# Patient Record
Sex: Female | Born: 1955 | Race: White | Hispanic: No | Marital: Married | Smoking: Current every day smoker
Health system: Southern US, Community
[De-identification: ages and names within clinical notes are randomized; demographics above are authoritative.]

## PROBLEM LIST (undated history)

## (undated) DIAGNOSIS — R011 Cardiac murmur, unspecified: Secondary | ICD-10-CM

## (undated) DIAGNOSIS — Z87891 Personal history of nicotine dependence: Secondary | ICD-10-CM

## (undated) DIAGNOSIS — Z1211 Encounter for screening for malignant neoplasm of colon: Secondary | ICD-10-CM

## (undated) DIAGNOSIS — Z1239 Encounter for other screening for malignant neoplasm of breast: Secondary | ICD-10-CM

## (undated) HISTORY — DX: Encounter for other screening for malignant neoplasm of breast: Z12.39

## (undated) HISTORY — DX: Encounter for screening for malignant neoplasm of colon: Z12.11

## (undated) HISTORY — PX: TUBAL LIGATION: SHX77

## (undated) HISTORY — DX: Personal history of nicotine dependence: Z87.891

## (undated) HISTORY — DX: Cardiac murmur, unspecified: R01.1

## (undated) HISTORY — PX: TONSILLECTOMY: SUR1361

---

## 2001-10-28 HISTORY — PX: BREAST BIOPSY: SHX20

## 2005-02-13 ENCOUNTER — Ambulatory Visit: Payer: Self-pay | Admitting: General Surgery

## 2006-03-14 ENCOUNTER — Ambulatory Visit: Payer: Self-pay | Admitting: General Surgery

## 2007-06-26 ENCOUNTER — Ambulatory Visit: Payer: Self-pay | Admitting: General Surgery

## 2008-06-30 ENCOUNTER — Ambulatory Visit: Payer: Self-pay | Admitting: General Surgery

## 2009-08-28 HISTORY — PX: COLONOSCOPY: SHX174

## 2009-09-02 ENCOUNTER — Ambulatory Visit: Payer: Self-pay | Admitting: General Surgery

## 2009-10-22 ENCOUNTER — Ambulatory Visit: Payer: Self-pay | Admitting: General Surgery

## 2010-11-14 ENCOUNTER — Ambulatory Visit: Payer: Self-pay | Admitting: General Surgery

## 2011-11-15 ENCOUNTER — Ambulatory Visit: Payer: Self-pay | Admitting: General Surgery

## 2012-10-12 ENCOUNTER — Encounter: Payer: Self-pay | Admitting: General Surgery

## 2012-11-26 ENCOUNTER — Ambulatory Visit: Payer: Self-pay | Admitting: General Surgery

## 2012-11-27 ENCOUNTER — Encounter: Payer: Self-pay | Admitting: General Surgery

## 2012-12-17 ENCOUNTER — Ambulatory Visit: Payer: Self-pay | Admitting: General Surgery

## 2012-12-26 ENCOUNTER — Encounter: Payer: Self-pay | Admitting: *Deleted

## 2013-01-28 ENCOUNTER — Other Ambulatory Visit: Payer: Self-pay

## 2013-01-28 ENCOUNTER — Ambulatory Visit (INDEPENDENT_AMBULATORY_CARE_PROVIDER_SITE_OTHER): Payer: Managed Care, Other (non HMO) | Admitting: General Surgery

## 2013-01-28 ENCOUNTER — Encounter: Payer: Self-pay | Admitting: General Surgery

## 2013-01-28 VITALS — BP 148/82 | HR 76 | Resp 14 | Ht 66.0 in | Wt 130.0 lb

## 2013-01-28 DIAGNOSIS — Z1239 Encounter for other screening for malignant neoplasm of breast: Secondary | ICD-10-CM

## 2013-01-28 DIAGNOSIS — N63 Unspecified lump in unspecified breast: Secondary | ICD-10-CM

## 2013-01-28 NOTE — Patient Instructions (Addendum)
Continue self breast exams. Call office for any new breast issues or concerns. 

## 2013-01-28 NOTE — Progress Notes (Signed)
Patient ID: Peggy Nielsen, female   DOB: 1956-03-16, 57 y.o.   MRN: [id number]  Chief Complaint  Patient presents with  . Follow-up    mammogram    HPI Peggy Nielsen is a 57 y.o. female.  Patient here today for follow up mammogram.  Denies breast issues. Patient with known history of left breast biopsy done 10-28-01. Patient was adopted, family history unknown.   HPI  Past Medical History  Diagnosis Date  . Personal history of tobacco use, presenting hazards to health   . Murmur   . Breast screening, unspecified   . Special screening for malignant neoplasms, colon     Past Surgical History  Procedure Laterality Date  . Tonsillectomy    . Tubal ligation    . Colonoscopy  2011    Dr. Evette Cristal  . Breast biopsy Left 10-28-01    Family History  Problem Relation Age of Onset  . Adopted: Yes    Social History History  Substance Use Topics  . Smoking status: Current Every Day Smoker -- 1.00 packs/day for 20 years  . Smokeless tobacco: Not on file  . Alcohol Use: Yes     Comment: occasionally    No Known Allergies  Current Outpatient Prescriptions  Medication Sig Dispense Refill  . CRESTOR 20 MG tablet Take 1 tablet by mouth daily.       No current facility-administered medications for this visit.    Review of Systems Review of Systems  Constitutional: Negative.   Respiratory: Negative.   Cardiovascular: Negative.     Blood pressure 148/82, pulse 76, resp. rate 14, height 5\' 6"  (1.676 m), weight 130 lb (58.968 kg).  Physical Exam Physical Exam  Constitutional: She is oriented to person, place, and time. She appears well-developed and well-nourished.  Eyes: Conjunctivae are normal.  Cardiovascular: Normal rate and regular rhythm.   Pulmonary/Chest: Effort normal and breath sounds normal. Right breast exhibits mass. Right breast exhibits no inverted nipple, no nipple discharge, no skin change and no tenderness. Left breast exhibits no inverted nipple, no mass, no nipple  discharge, no skin change and no tenderness.  Lymphadenopathy:    She has no cervical adenopathy.    She has no axillary adenopathy.  Neurological: She is alert and oriented to person, place, and time.  Skin: Skin is warm and dry.  5 mm nodule right breast areolar margin at 10-11 o'clock position   Data Reviewed Mammogram reviewed  Assessment    5 mm nodule right breast areolar margin at 10-11 o'clock position  Ultrasound shows 6.54mm size hypoechoic mass over palpable area      Plan    Close follow up Recheck in 3 months       SANKAR,SEEPLAPUTHUR G 01/30/2013, 6:12 AM

## 2013-01-30 ENCOUNTER — Encounter: Payer: Self-pay | Admitting: General Surgery

## 2013-01-30 DIAGNOSIS — N63 Unspecified lump in unspecified breast: Secondary | ICD-10-CM | POA: Insufficient documentation

## 2013-05-20 ENCOUNTER — Ambulatory Visit (INDEPENDENT_AMBULATORY_CARE_PROVIDER_SITE_OTHER): Payer: Managed Care, Other (non HMO) | Admitting: General Surgery

## 2013-05-20 ENCOUNTER — Encounter: Payer: Self-pay | Admitting: General Surgery

## 2013-05-20 VITALS — BP 144/80 | HR 70 | Resp 12 | Ht 66.0 in | Wt 128.0 lb

## 2013-05-20 DIAGNOSIS — N63 Unspecified lump in unspecified breast: Secondary | ICD-10-CM

## 2013-05-20 NOTE — Progress Notes (Signed)
Patient ID: Peggy Nielsen, female   DOB: 24-Oct-1955, 57 y.o.   MRN: [id number]  Chief Complaint  Patient presents with  . Follow-up    HPI Peggy Nielsen is a 57 y.o. female.  Here today for 3 months follow up for a 5 mm nodule right breast areolar margin at 10-11 o'clock position.  Denies any new breast issues.  HPI  Past Medical History  Diagnosis Date  . Personal history of tobacco use, presenting hazards to health   . Murmur   . Breast screening, unspecified   . Special screening for malignant neoplasms, colon     Past Surgical History  Procedure Laterality Date  . Tonsillectomy    . Tubal ligation    . Colonoscopy  2011    Dr. Evette Cristal  . Breast biopsy Left 10-28-01    Family History  Problem Relation Age of Onset  . Adopted: Yes    Social History History  Substance Use Topics  . Smoking status: Current Every Day Smoker -- 1.00 packs/day for 20 years  . Smokeless tobacco: Not on file  . Alcohol Use: Yes     Comment: occasionally    Allergies  Allergen Reactions  . Ceclor [Cefaclor] Swelling    Current Outpatient Prescriptions  Medication Sig Dispense Refill  . CRESTOR 20 MG tablet Take 1 tablet by mouth daily.       No current facility-administered medications for this visit.    Review of Systems Review of Systems  Constitutional: Negative.   Respiratory: Negative.   Cardiovascular: Negative.     Blood pressure 144/80, pulse 70, resp. rate 12, height 5\' 6"  (1.676 m), weight 128 lb (58.06 kg).  Physical Exam Physical Exam  Constitutional: She is oriented to person, place, and time. She appears well-developed and well-nourished.  Pulmonary/Chest: Right breast exhibits mass (5-6 mm nodule at 10 o'clock areolar margin). Right breast exhibits no inverted nipple, no nipple discharge, no skin change and no tenderness.  Lymphadenopathy:    She has no axillary adenopathy.  Neurological: She is alert and oriented to person, place, and time.  Skin: Skin is warm  and dry.    Data Reviewed  Prior ultrasound Assessment    Presistant right breast mass.    Plan    discussed observation or excision in the office and patient prefers to have an excision.       Anvi Mangal G 05/20/2013, 11:20 AM

## 2013-05-20 NOTE — Patient Instructions (Addendum)
Continue self breast exams. Call office for any new breast issues or concerns. 

## 2013-06-11 ENCOUNTER — Encounter: Payer: Self-pay | Admitting: General Surgery

## 2013-06-11 ENCOUNTER — Ambulatory Visit (INDEPENDENT_AMBULATORY_CARE_PROVIDER_SITE_OTHER): Payer: Managed Care, Other (non HMO) | Admitting: General Surgery

## 2013-06-11 VITALS — BP 150/80 | HR 76 | Resp 14 | Ht 66.0 in | Wt 128.0 lb

## 2013-06-11 DIAGNOSIS — N63 Unspecified lump in unspecified breast: Secondary | ICD-10-CM

## 2013-06-11 NOTE — Progress Notes (Signed)
Patient ID: Peggy Nielsen, female   DOB: 1956-04-30, 57 y.o.   MRN: [id number] Here today for excision of right breast mass.  Procedure:   Mass in right breast at 10 ocl atreolar margin was again identified. 7ml of 1% xylocaine mixed with 0.5%marcaine was instilled.  Area prepped with chlora prep. 1.5cm circumareolar incision made at 10 o'cl. Mass identified -6-75mm size and excised out.  Bleeding controlled with disposable cautery. Wound closed with 3-0 vicryl in deeper plane and skin closed with 4-0 vicryl subcuticular.  dermabond applied. Prcedure well tolerated.

## 2013-06-11 NOTE — Patient Instructions (Addendum)
Keep area clean Follow up as scheduled Use ice pack for comfort today

## 2013-06-17 ENCOUNTER — Telehealth: Payer: Self-pay | Admitting: General Surgery

## 2013-06-17 ENCOUNTER — Telehealth: Payer: Self-pay | Admitting: *Deleted

## 2013-06-17 NOTE — Telephone Encounter (Signed)
PATIENT CALLED TODAY TO OBTAIN HER RESULTS FROM A RT BR BX DONE 06-11-13 IN THE OFFICE.PLEASE CONTACT WITH RESULTS.

## 2013-06-17 NOTE — Telephone Encounter (Signed)
Notified patient as instructed, patient pleased. Discussed follow-up appointments, patient agrees  

## 2013-06-18 LAB — PATHOLOGY

## 2013-07-03 ENCOUNTER — Other Ambulatory Visit: Payer: Self-pay

## 2013-12-11 ENCOUNTER — Ambulatory Visit: Payer: Self-pay | Admitting: General Surgery

## 2013-12-15 ENCOUNTER — Encounter: Payer: Self-pay | Admitting: General Surgery

## 2013-12-18 ENCOUNTER — Ambulatory Visit: Payer: Managed Care, Other (non HMO) | Admitting: General Surgery

## 2013-12-29 ENCOUNTER — Ambulatory Visit (INDEPENDENT_AMBULATORY_CARE_PROVIDER_SITE_OTHER): Payer: Managed Care, Other (non HMO) | Admitting: General Surgery

## 2013-12-29 ENCOUNTER — Encounter: Payer: Self-pay | Admitting: General Surgery

## 2013-12-29 VITALS — BP 124/74 | HR 72 | Resp 12 | Ht 66.0 in | Wt 125.0 lb

## 2013-12-29 DIAGNOSIS — L989 Disorder of the skin and subcutaneous tissue, unspecified: Secondary | ICD-10-CM

## 2013-12-29 DIAGNOSIS — N6019 Diffuse cystic mastopathy of unspecified breast: Secondary | ICD-10-CM

## 2013-12-29 DIAGNOSIS — N63 Unspecified lump in unspecified breast: Secondary | ICD-10-CM

## 2013-12-29 NOTE — Progress Notes (Signed)
Patient ID: Peggy Nielsen, female   DOB: [birth year], 58 y.o.   MRN: [id number]  Chief Complaint  Patient presents with  . Follow-up    mammogram    HPI [name] is a 58 y.o. female who presents for a breast evaluation. The most recent mammogram was done on 12/02/13 Patient does perform regular self breast checks and gets regular mammograms done.  Last year she had a small right breast mass subareolar excised. Pathology showed benign process with apocrine cysts.   HPI  Past Medical History  Diagnosis Date  . Personal history of tobacco use, presenting hazards to health   . Murmur   . Breast screening, unspecified   . Special screening for malignant neoplasms, colon     Past Surgical History  Procedure Laterality Date  . Tonsillectomy    . Tubal ligation    . Colonoscopy  2011    Dr. Jamal Collin  . Breast biopsy Left 10-28-01    Family History  Problem Relation Age of Onset  . Adopted: Yes    Social History History  Substance Use Topics  . Smoking status: Current Every Day Smoker -- 1.00 packs/day for 20 years  . Smokeless tobacco: Never Used  . Alcohol Use: Yes     Comment: occasionally    Allergies  Allergen Reactions  . Ceclor [Cefaclor] Swelling    Current Outpatient Prescriptions  Medication Sig Dispense Refill  . CRESTOR 20 MG tablet Take 1 tablet by mouth daily.       No current facility-administered medications for this visit.    Review of Systems Review of Systems  Constitutional: Negative.   Respiratory: Negative.   Cardiovascular: Negative.     Blood pressure 124/74, pulse 72, resp. rate 12, height 5\' 6"  (1.676 m), weight 125 lb (56.7 kg).  Physical Exam Physical Exam  Constitutional: She is oriented to person, place, and time. She appears well-developed and well-nourished. No distress.  HENT:  Head: Normocephalic and atraumatic.  Eyes: Conjunctivae are normal.  Neck: Neck supple.  Cardiovascular: Normal rate, regular rhythm and normal heart  sounds.  Exam reveals no gallop and no friction rub.   No murmur heard. Pulmonary/Chest: Effort normal and breath sounds normal. Right breast exhibits no inverted nipple, no mass, no nipple discharge, no skin change and no tenderness. Left breast exhibits no inverted nipple, no mass, no nipple discharge, no skin change and no tenderness. Breasts are symmetrical.  Lymphadenopathy:    She has no cervical adenopathy.    She has no axillary adenopathy.  Neurological: She is alert and oriented to person, place, and time.  Skin: Skin is dry.  6 mm fibrotic dark skin tag left upper abdominal wall appears larger than previous visit.   Data Reviewed Mammogram reviewed and stable.    Assessment    Stable exam. History of fibrocystic disease. Family history unknown. Enlarging skin tag left abdominal wall.     Plan       Skin tag removed today in office.  Area prepped with chloroprep. 0.5 ml 1%xylocaine instilled at base of ski tag.  Skin tag excised at base with scalpel, cauterized base with thermal cautery. Dressed with neosporin and band aid.    Seeplaputhur G Sankar 12/30/2013, 3:03 PM

## 2013-12-29 NOTE — Patient Instructions (Signed)
Patient to return in one year bilateral screening mammogram.

## 2013-12-30 ENCOUNTER — Encounter: Payer: Self-pay | Admitting: General Surgery

## 2013-12-30 DIAGNOSIS — N6019 Diffuse cystic mastopathy of unspecified breast: Secondary | ICD-10-CM | POA: Insufficient documentation

## 2013-12-31 LAB — PATHOLOGY

## 2014-01-06 ENCOUNTER — Telehealth: Payer: Self-pay | Admitting: *Deleted

## 2014-01-06 NOTE — Telephone Encounter (Signed)
Message copied by Carson Myrtle on Tue Jan 06, 2014  8:45 AM ------      Message from: Christene Lye      Created: Mon Jan 05, 2014  7:05 PM       Please let pt pt know the pathology was normal. ------

## 2014-01-06 NOTE — Telephone Encounter (Signed)
Notified patient as instructed, patient pleased. Discussed follow-up appointments, patient agrees  

## 2014-06-29 ENCOUNTER — Encounter: Payer: Self-pay | Admitting: General Surgery

## 2014-12-16 ENCOUNTER — Ambulatory Visit: Payer: Managed Care, Other (non HMO) | Admitting: General Surgery

## 2014-12-23 ENCOUNTER — Encounter: Payer: Self-pay | Admitting: General Surgery

## 2014-12-28 ENCOUNTER — Ambulatory Visit (INDEPENDENT_AMBULATORY_CARE_PROVIDER_SITE_OTHER): Payer: BLUE CROSS/BLUE SHIELD | Admitting: General Surgery

## 2014-12-28 VITALS — BP 158/76 | HR 70 | Resp 14 | Ht 66.0 in | Wt 124.0 lb

## 2014-12-28 DIAGNOSIS — N6019 Diffuse cystic mastopathy of unspecified breast: Secondary | ICD-10-CM | POA: Diagnosis not present

## 2014-12-28 NOTE — Progress Notes (Signed)
Patient ID: Peggy Nielsen, female   DOB: [birth year], 59 y.o.   MRN: [id number]  Chief Complaint  Patient presents with  . Follow-up    mammogram    HPI Peggy Nielsen is a 59 y.o. female who presents for a breast evaluation. The most recent mammogram was done on 12/21/14. Patient does perform regular self breast checks and gets regular mammograms done.     HPI  Past Medical History  Diagnosis Date  . Personal history of tobacco use, presenting hazards to health   . Murmur   . Breast screening, unspecified   . Special screening for malignant neoplasms, colon     Past Surgical History  Procedure Laterality Date  . Tonsillectomy    . Tubal ligation    . Colonoscopy  2011    Dr. Jamal Collin  . Breast biopsy Left 10-28-01    Family History  Problem Relation Age of Onset  . Adopted: Yes    Social History History  Substance Use Topics  . Smoking status: Current Every Day Smoker -- 1.00 packs/day for 20 years  . Smokeless tobacco: Never Used  . Alcohol Use: Yes     Comment: occasionally    Allergies  Allergen Reactions  . Ceclor [Cefaclor] Swelling    Current Outpatient Prescriptions  Medication Sig Dispense Refill  . CRESTOR 20 MG tablet Take 1 tablet by mouth daily.    Marland Kitchen lisinopril (PRINIVIL,ZESTRIL) 10 MG tablet      No current facility-administered medications for this visit.    Review of Systems Review of Systems  Constitutional: Negative.   Respiratory: Negative.   Cardiovascular: Negative.     Blood pressure 158/76, pulse 70, resp. rate 14, height 5\' 6"  (1.676 m), weight 124 lb (56.246 kg).  Physical Exam Physical Exam  Constitutional: She is oriented to person, place, and time. She appears well-developed and well-nourished.  Eyes: Conjunctivae are normal. No scleral icterus.  Neck: Neck supple.  Cardiovascular: Normal rate, regular rhythm and normal heart sounds.   Pulmonary/Chest: Effort normal and breath sounds normal. Right breast exhibits no inverted  nipple, no mass, no nipple discharge, no skin change and no tenderness. Left breast exhibits no inverted nipple, no mass, no nipple discharge, no skin change and no tenderness.  Abdominal: Soft. There is no hepatomegaly. There is no tenderness.  Lymphadenopathy:    She has no cervical adenopathy.    She has no axillary adenopathy.  Neurological: She is alert and oriented to person, place, and time.  Skin: Skin is warm and dry.    Data Reviewed Mammogram reviewed   Assessment    Stable exam      Plan    Patient will be asked to return to the office in one year with a bilateral screening mammogram.       Sashay Felling G 12/29/2014, 5:30 AM

## 2014-12-29 ENCOUNTER — Encounter: Payer: Self-pay | Admitting: General Surgery

## 2015-04-22 ENCOUNTER — Other Ambulatory Visit: Payer: Self-pay | Admitting: Orthopedic Surgery

## 2015-04-22 DIAGNOSIS — M5416 Radiculopathy, lumbar region: Secondary | ICD-10-CM

## 2015-04-24 ENCOUNTER — Ambulatory Visit: Payer: BLUE CROSS/BLUE SHIELD

## 2015-05-05 ENCOUNTER — Ambulatory Visit
Admission: RE | Admit: 2015-05-05 | Discharge: 2015-05-05 | Disposition: A | Discharge status: Home / Self Care | Payer: BLUE CROSS/BLUE SHIELD | Source: Ambulatory Visit | Attending: Orthopedic Surgery | Admitting: Orthopedic Surgery

## 2015-05-05 DIAGNOSIS — M5416 Radiculopathy, lumbar region: Secondary | ICD-10-CM | POA: Diagnosis present

## 2015-05-05 DIAGNOSIS — G9589 Other specified diseases of spinal cord: Secondary | ICD-10-CM | POA: Diagnosis not present

## 2015-05-05 DIAGNOSIS — M5127 Other intervertebral disc displacement, lumbosacral region: Secondary | ICD-10-CM | POA: Insufficient documentation

## 2015-10-18 ENCOUNTER — Encounter: Payer: Self-pay | Admitting: *Deleted

## 2016-01-04 ENCOUNTER — Ambulatory Visit: Payer: BLUE CROSS/BLUE SHIELD | Admitting: General Surgery

## 2016-01-17 ENCOUNTER — Encounter: Payer: Self-pay | Admitting: General Surgery

## 2016-02-03 ENCOUNTER — Ambulatory Visit: Payer: BLUE CROSS/BLUE SHIELD | Admitting: General Surgery

## 2016-02-10 ENCOUNTER — Ambulatory Visit: Payer: BLUE CROSS/BLUE SHIELD | Admitting: General Surgery

## 2016-02-14 ENCOUNTER — Ambulatory Visit (INDEPENDENT_AMBULATORY_CARE_PROVIDER_SITE_OTHER): Payer: BLUE CROSS/BLUE SHIELD | Admitting: General Surgery

## 2016-02-14 ENCOUNTER — Encounter: Payer: Self-pay | Admitting: General Surgery

## 2016-02-14 VITALS — BP 140/70 | HR 80 | Resp 14 | Ht 64.0 in | Wt 127.0 lb

## 2016-02-14 DIAGNOSIS — N6019 Diffuse cystic mastopathy of unspecified breast: Secondary | ICD-10-CM | POA: Diagnosis not present

## 2016-02-14 NOTE — Patient Instructions (Signed)
The patient has been asked to return to the office in one year with a bilateral screening mammogram. 

## 2016-02-14 NOTE — Progress Notes (Signed)
Patient ID: Peggy Nielsen, female   DOB: 03/24/1956, 60 y.o.   MRN: [id number]  Chief Complaint  Patient presents with  . Follow-up    mammogram     HPI Peggy Nielsen is a 60 y.o. female who presents for a breast evaluation. The most recent mammogram was done on 01/12/2016.  Patient does perform regular self breast checks and gets regular mammograms done.   I have reviewed the history of present illness with the patient.   HPI  Past Medical History  Diagnosis Date  . Personal history of tobacco use, presenting hazards to health   . Murmur   . Breast screening, unspecified   . Special screening for malignant neoplasms, colon     Past Surgical History  Procedure Laterality Date  . Tonsillectomy    . Tubal ligation    . Colonoscopy  2011    Dr. Jamal Collin  . Breast biopsy Left 10-28-01    Family History  Problem Relation Age of Onset  . Adopted: Yes    Social History Social History  Substance Use Topics  . Smoking status: Current Every Day Smoker -- 1.00 packs/day for 20 years  . Smokeless tobacco: Never Used  . Alcohol Use: Yes     Comment: occasionally    Allergies  Allergen Reactions  . Ceclor [Cefaclor] Swelling    Current Outpatient Prescriptions  Medication Sig Dispense Refill  . CRESTOR 20 MG tablet Take 1 tablet by mouth daily.    Marland Kitchen lisinopril (PRINIVIL,ZESTRIL) 10 MG tablet      No current facility-administered medications for this visit.    Review of Systems Review of Systems  Constitutional: Negative.   Respiratory: Negative.   Cardiovascular: Negative.     Blood pressure 140/70, pulse 80, resp. rate 14, height 5\' 4"  (1.626 m), weight 127 lb (57.607 kg).  Physical Exam Physical Exam  Constitutional: She is oriented to person, place, and time. She appears well-developed.  Eyes: Conjunctivae are normal. No scleral icterus.  Neck: Neck supple.  Cardiovascular: Normal rate, regular rhythm and intact distal pulses.   Pulmonary/Chest: Effort normal and  breath sounds normal. Right breast exhibits no inverted nipple, no mass, no nipple discharge, no skin change and no tenderness. Left breast exhibits no inverted nipple, no mass, no nipple discharge, no skin change and no tenderness.  Abdominal: Soft. Bowel sounds are normal. There is no tenderness.  Lymphadenopathy:    She has no cervical adenopathy.    She has no axillary adenopathy.  Neurological: She is alert and oriented to person, place, and time.  Skin: Skin is warm and dry.    Data Reviewed Mammogram reviewed and stable.  Assessment      Stable exam, Fibrocystic breast disease  Plan   The patient has been asked to return to the office in one year with a bilateral screening mammogram. Patient to continue with self-breast exams.     PCP:  Kathryne Eriksson 02/16/2016, 9:46 AM

## 2016-02-16 ENCOUNTER — Encounter: Payer: Self-pay | Admitting: General Surgery

## 2016-06-07 ENCOUNTER — Other Ambulatory Visit: Payer: Self-pay | Admitting: Neurology

## 2016-06-07 DIAGNOSIS — G250 Essential tremor: Secondary | ICD-10-CM

## 2016-06-20 ENCOUNTER — Ambulatory Visit: Payer: BLUE CROSS/BLUE SHIELD

## 2016-07-05 ENCOUNTER — Ambulatory Visit
Admission: RE | Admit: 2016-07-05 | Discharge: 2016-07-05 | Disposition: A | Discharge status: Home / Self Care | Payer: BLUE CROSS/BLUE SHIELD | Source: Ambulatory Visit | Attending: Neurology | Admitting: Neurology

## 2016-07-05 DIAGNOSIS — R9089 Other abnormal findings on diagnostic imaging of central nervous system: Secondary | ICD-10-CM | POA: Diagnosis not present

## 2016-07-05 DIAGNOSIS — G250 Essential tremor: Secondary | ICD-10-CM | POA: Diagnosis present

## 2016-07-05 DIAGNOSIS — R251 Tremor, unspecified: Secondary | ICD-10-CM | POA: Diagnosis present

## 2016-07-29 IMAGING — MR MR LUMBAR SPINE W/O CM
5 series · 35 of 48 positions shown · non-contrast
Comparison: None.

CLINICAL DATA: Low back pain and left leg pain since February 2015.

EXAM:
MRI LUMBAR SPINE WITHOUT CONTRAST
TECHNIQUE: Multiplanar, multisequence MR imaging of the lumbar spine was
performed. No intravenous contrast was administered.

[Series 2: T2 · sagittal · 4.0mm · 0.81mm/px · 6 of 17 slices shown (1 of 2)]
[im 1/17]
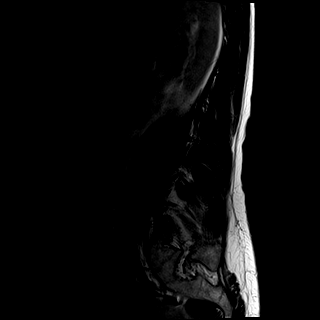
[im 4/17]
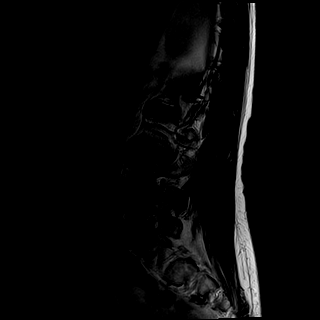
[im 7/17]
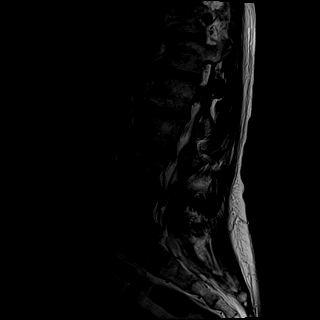
[im 10/17]
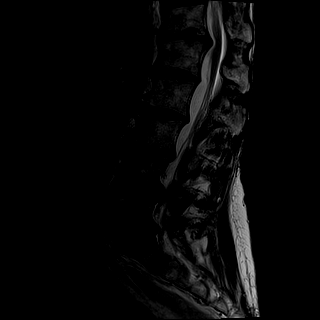
[im 13/17]
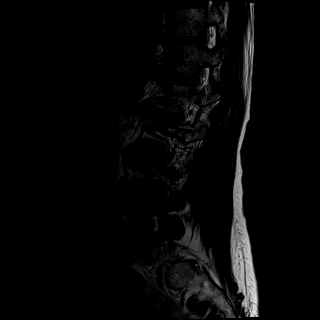
[im 17/17]
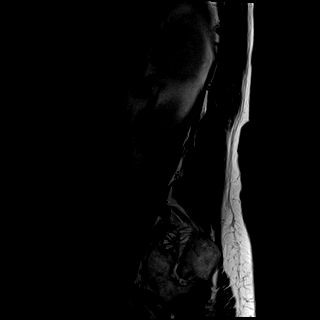

[Series 3: T1 · sagittal · 4.0mm · 0.81mm/px · 6 of 17 slices shown (1 of 2)]
[im 1/17]
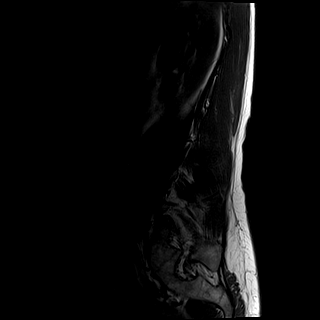
[im 4/17]
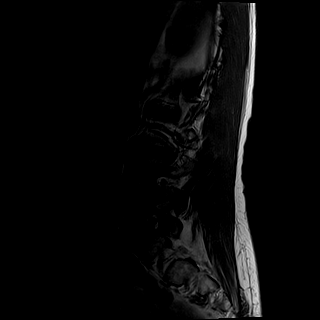
[im 7/17]
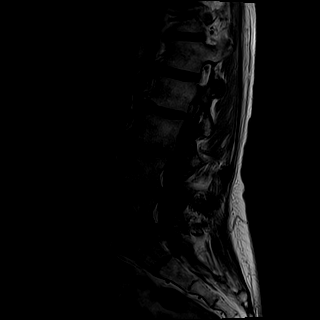
[im 10/17]
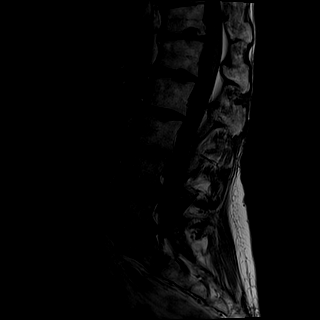
[im 13/17]
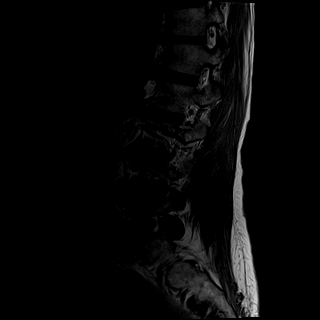
[im 17/17]
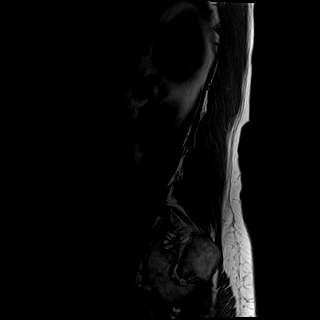

[Series 4: STIR · sagittal · 4.0mm · 1.02mm/px · 5 of 17 slices shown]
[im 1/17]
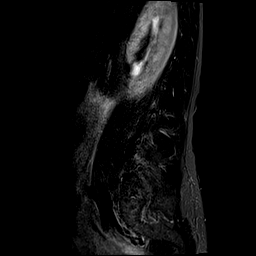
[im 4/17]
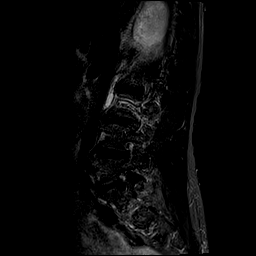
[im 7/17]
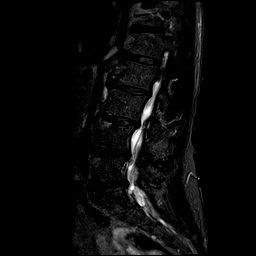
[im 10/17]
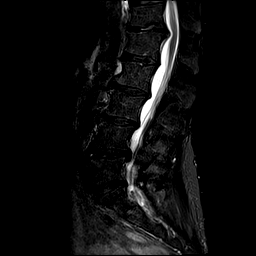
[im 13/17]
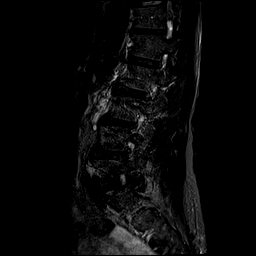

[Series 5: T2 · axial · 4.0mm · 0.78mm/px · z∈[-130,+80]mm · 9 of 39 slices shown (2 of 2)]
[im 1/39]
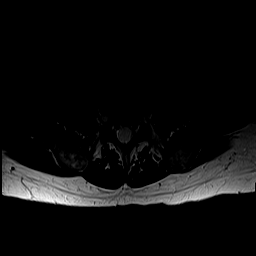
[im 6/39]
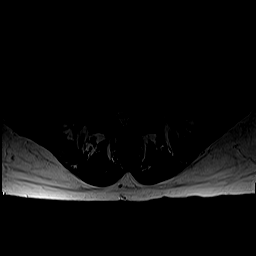
[im 11/39]
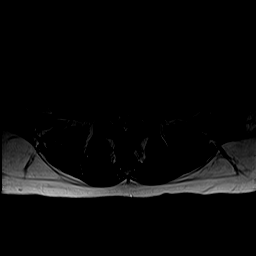
[im 17/39]
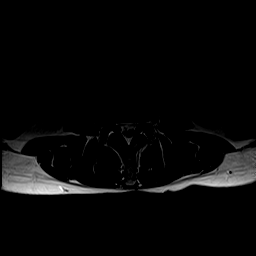
[im 20/39]
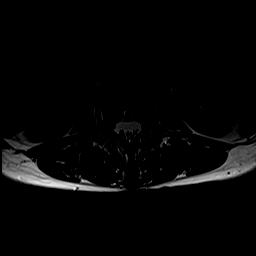
[im 22/39]
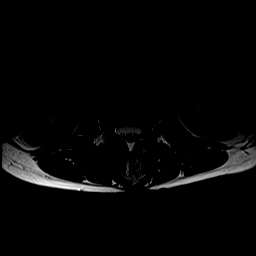
[im 28/39]
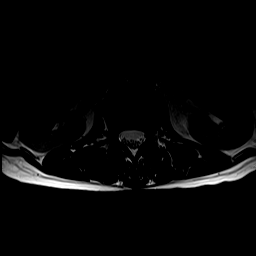
[im 33/39]
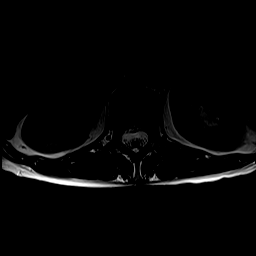
[im 39/39]
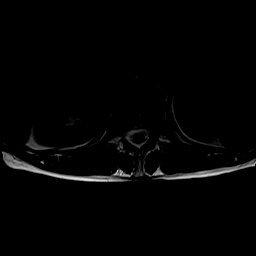

[Series 6: T1 · axial · 4.0mm · 0.39mm/px · z∈[-130,+80]mm · 9 of 39 slices shown (2 of 2)]
[im 1/39]
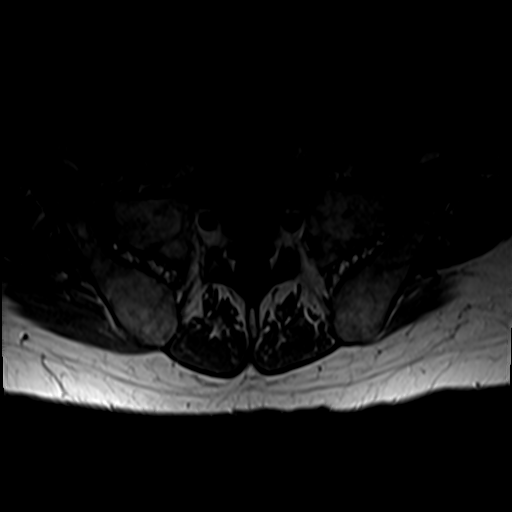
[im 6/39]
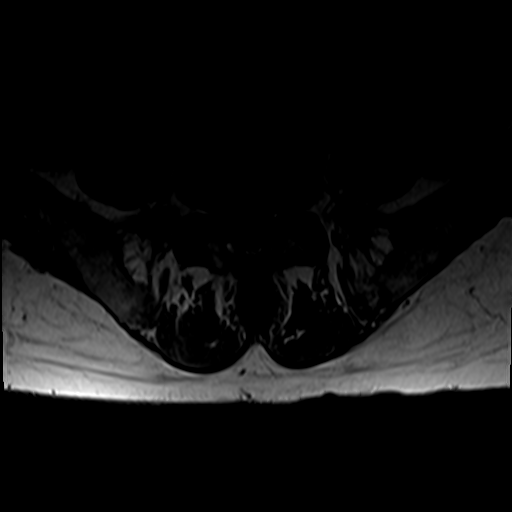
[im 11/39]
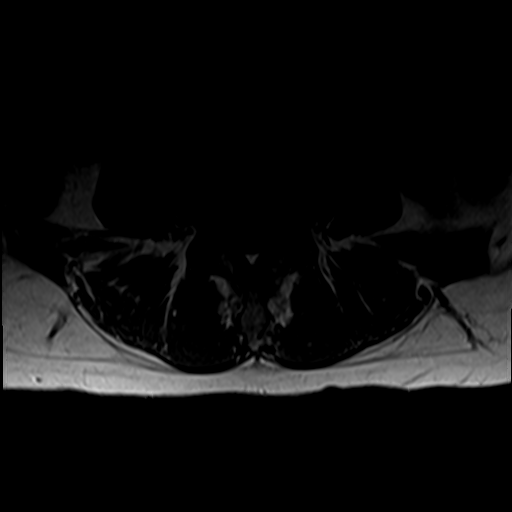
[im 17/39]
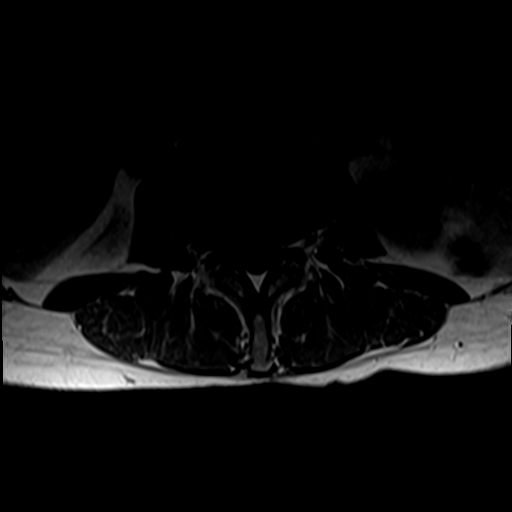
[im 20/39]
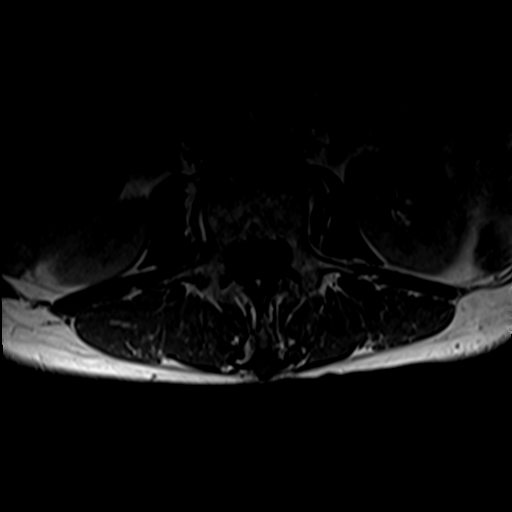
[im 22/39]
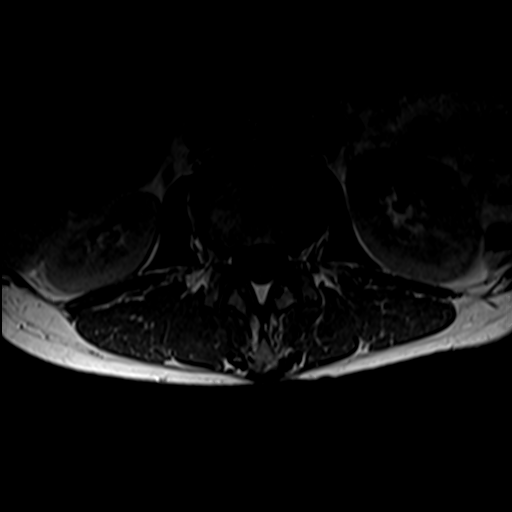
[im 28/39]
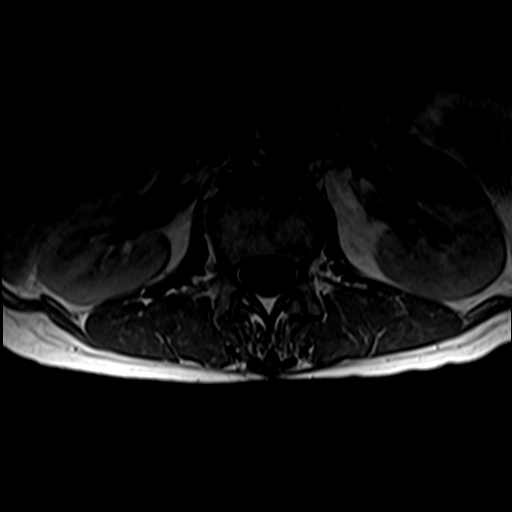
[im 33/39]
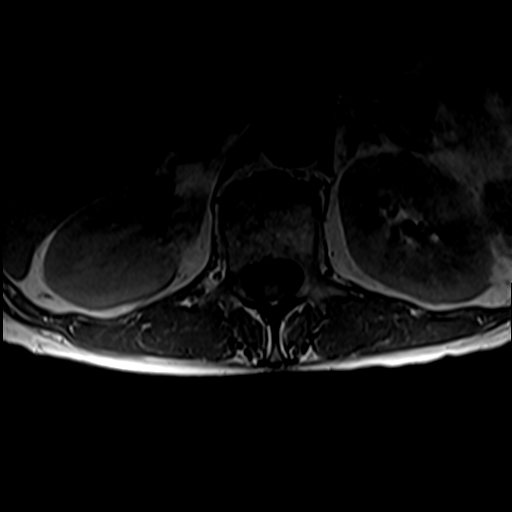
[im 39/39]
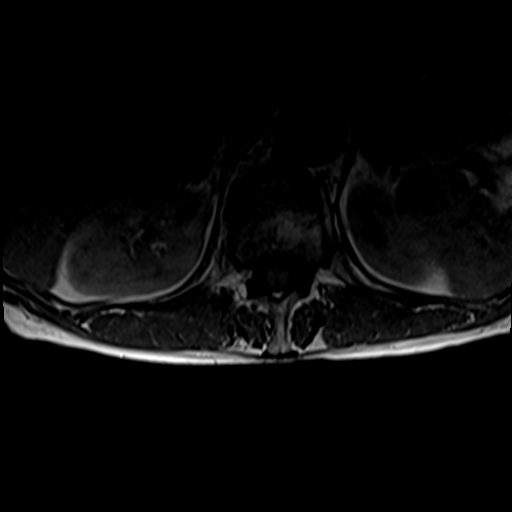

[35 of 48 positions shown; findings below may reference images not displayed]

FINDINGS: Conus tip is at L1-2.  Normal paraspinal soft tissues.

T12-L1: There is a focal soft disc protrusion just to the right of
midline which distorts the contour of the spinal cord. There is
minimal increased signal within the spinal cord at that level
consistent with slight myelopathy. CSF is seen between the disc
protrusion and the distorted spinal cord. There is chronic disc
space narrowing.

L1-2: Tiny disc bulge just to the right of midline with no neural
impingement.

L2-3: Small broad-based disc bulge with no neural impingement.
Minimal narrowing of the AP dimension of the spinal canal.

L3-4: 2 mm retrolisthesis with a small broad-based disc bulge
slightly narrowing the spinal canal without neural impingement.
Prominent right extra foraminal disc bulge.

L4-5: Broad-based soft disc protrusion with a focal 13 x 7 x 10 mm
soft disc extrusion central and slightly to the right of midline
extending inferiorly compressing the right side of the thecal sac.
This compresses the right L5 nerve within the thecal sac. The left
L5 nerve could also be affected. The disc protrusion compresses the
thecal sac creating moderate spinal stenosis. The protrusion extends
into the left neural foramen creating left foraminal stenosis and
the left L4 nerve appears impinged upon under the pedicle.
Protrusion into the right neural foramen does not compress the L4
nerve.

L5-S1: Central soft disc protrusion slightly asymmetric to the right
without focal neural impingement. The protrusion does slightly
indent the ventral aspect of the thecal sac. There is moderate
bilateral foraminal narrowing without focal neural impingement.
IMPRESSION: 1. Soft disc protrusion and extrusion at L4-5 which appears to
compress the left L4 nerve in the neural foramen and the right L5
nerve in the lateral recess. The left L5 nerve could be affected in
the lateral recess as well.
2. Small central soft disc protrusion at L5-S1 without focal neural
impingement.
3. Soft disc protrusion to the right of midline at T12-L1 with
slight distortion of the spinal cord and minimal myelopathy.

## 2017-02-08 ENCOUNTER — Ambulatory Visit: Payer: BLUE CROSS/BLUE SHIELD | Admitting: General Surgery

## 2017-02-13 ENCOUNTER — Encounter: Payer: Self-pay | Admitting: General Surgery

## 2017-02-14 ENCOUNTER — Ambulatory Visit (INDEPENDENT_AMBULATORY_CARE_PROVIDER_SITE_OTHER): Payer: BLUE CROSS/BLUE SHIELD | Admitting: General Surgery

## 2017-02-14 ENCOUNTER — Encounter: Payer: Self-pay | Admitting: General Surgery

## 2017-02-14 VITALS — BP 128/64 | HR 62 | Resp 14 | Ht 66.0 in | Wt 124.0 lb

## 2017-02-14 DIAGNOSIS — N6012 Diffuse cystic mastopathy of left breast: Secondary | ICD-10-CM | POA: Diagnosis not present

## 2017-02-14 DIAGNOSIS — N6019 Diffuse cystic mastopathy of unspecified breast: Secondary | ICD-10-CM

## 2017-02-14 DIAGNOSIS — N6011 Diffuse cystic mastopathy of right breast: Secondary | ICD-10-CM

## 2017-02-14 NOTE — Patient Instructions (Addendum)
Patient to follow up with PCP in one year with bilateral screening mammogram. Patient to continue with self-breast exams monthly. Advised to call office with questions or concerns.

## 2017-02-14 NOTE — Progress Notes (Signed)
Patient ID: Peggy Nielsen, female   DOB: 05/07/1956, 61 y.o.   MRN: [id number]  Chief Complaint  Patient presents with  . Follow-up    mammogram    HPI Peggy Nielsen is a 61 y.o. female.  who presents for a breast evaluation. The most recent mammogram was done on 02-01-17.  Patient does perform regular self breast checks and gets regular mammograms done.  No new breast issues.Marland Kitchen   HPI  Past Medical History:  Diagnosis Date  . Breast screening, unspecified   . Murmur   . Personal history of tobacco use, presenting hazards to health   . Special screening for malignant neoplasms, colon     Past Surgical History:  Procedure Laterality Date  . BREAST BIOPSY Left 10-28-01  . COLONOSCOPY  2011   Dr. Jamal Collin  . TONSILLECTOMY    . TUBAL LIGATION      Family History  Problem Relation Age of Onset  . Adopted: Yes  . Hypertension Mother   . Hyperlipidemia Mother     Social History Social History  Substance Use Topics  . Smoking status: Current Every Day Smoker    Packs/day: 1.00    Years: 20.00  . Smokeless tobacco: Never Used  . Alcohol use Yes     Comment: occasionally    Allergies  Allergen Reactions  . Ceclor [Cefaclor] Swelling    Current Outpatient Prescriptions  Medication Sig Dispense Refill  . ALPRAZolam (XANAX) 0.5 MG tablet Take 0.5 mg by mouth 2 (two) times daily.     Marland Kitchen amLODipine (NORVASC) 10 MG tablet Take 10 mg by mouth daily.  1  . aspirin EC 81 MG tablet Take 81 mg by mouth daily.     . Cholecalciferol (VITAMIN D3) 1000 units CAPS Take by mouth daily.     . CRESTOR 20 MG tablet Take 1 tablet by mouth daily.    Marland Kitchen lisinopril (PRINIVIL,ZESTRIL) 40 MG tablet Take 40 mg by mouth daily.  1  . primidone (MYSOLINE) 50 MG tablet Take 50 mg by mouth 2 (two) times daily.     . vitamin B-12 (CYANOCOBALAMIN) 1000 MCG tablet Take 1,000 mcg by mouth daily.      No current facility-administered medications for this visit.     Review of Systems Review of Systems   Constitutional: Negative.   Respiratory: Negative.   Cardiovascular: Negative.     Blood pressure 128/64, pulse 62, resp. rate 14, height 5\' 6"  (1.676 m), weight 124 lb (56.2 kg).  Physical Exam Physical Exam  Constitutional: She is oriented to person, place, and time. She appears well-developed and well-nourished.  Eyes: Conjunctivae are normal. No scleral icterus.  Neck: Neck supple.  Cardiovascular: Normal rate, regular rhythm and normal heart sounds.   Pulmonary/Chest: Effort normal and breath sounds normal. Right breast exhibits no inverted nipple, no mass, no nipple discharge, no skin change and no tenderness. Left breast exhibits no inverted nipple, no mass, no nipple discharge, no skin change and no tenderness.  Abdominal: Soft. Normal appearance and bowel sounds are normal.  Lymphadenopathy:    She has no cervical adenopathy.    She has no axillary adenopathy.  Neurological: She is alert and oriented to person, place, and time.  Skin: Skin is warm and dry.  Psychiatric: She has a normal mood and affect. Her behavior is normal.    Data Reviewed Prior notes and mammogram reviewed.  Assessment    History of fibrocystic breast disease- no changes in breast or complaints. Next colonoscopy  due in 2021.  Stable exam otherwise.    Plan    Patient to follow up with PCP in one year with bilateral screening mammogram. Patient to continue with self-breast exams monthly. Advised to call office with questions or concerns.   Patient to call office to set up colonoscopy in 2021 with Dr. Bary Castilla.     HPI, Physical Exam, Assessment and Plan have been scribed under the direction and in the presence of Mckinley Jewel, MD  Karie Fetch, RN  I have completed the exam and reviewed the above documentation for accuracy and completeness.  I agree with the above.  Haematologist has been used and any errors in dictation or transcription are unintentional.  Seeplaputhur G. Jamal Collin, M.D.,  F.A.C.S.   Junie Panning G 02/14/2017, 4:48 PM

## 2017-10-26 DEATH — deceased

## 2019-03-28 ENCOUNTER — Encounter: Payer: Self-pay | Admitting: General Surgery

## 2019-07-02 ENCOUNTER — Other Ambulatory Visit: Payer: Self-pay | Admitting: Family Medicine

## 2019-07-02 DIAGNOSIS — R1030 Lower abdominal pain, unspecified: Secondary | ICD-10-CM

## 2020-10-21 ENCOUNTER — Ambulatory Visit (INDEPENDENT_AMBULATORY_CARE_PROVIDER_SITE_OTHER): Payer: BC Managed Care – PPO | Admitting: Dermatology

## 2020-10-21 ENCOUNTER — Other Ambulatory Visit: Payer: Self-pay

## 2020-10-21 ENCOUNTER — Encounter: Payer: Self-pay | Admitting: Dermatology

## 2020-10-21 DIAGNOSIS — T148XXA Other injury of unspecified body region, initial encounter: Secondary | ICD-10-CM

## 2020-10-21 DIAGNOSIS — D492 Neoplasm of unspecified behavior of bone, soft tissue, and skin: Secondary | ICD-10-CM | POA: Diagnosis not present

## 2020-10-21 DIAGNOSIS — S61203A Unspecified open wound of left middle finger without damage to nail, initial encounter: Secondary | ICD-10-CM | POA: Diagnosis not present

## 2020-10-21 DIAGNOSIS — L578 Other skin changes due to chronic exposure to nonionizing radiation: Secondary | ICD-10-CM

## 2020-10-21 DIAGNOSIS — L821 Other seborrheic keratosis: Secondary | ICD-10-CM

## 2020-10-21 DIAGNOSIS — L709 Acne, unspecified: Secondary | ICD-10-CM

## 2020-10-21 DIAGNOSIS — L738 Other specified follicular disorders: Secondary | ICD-10-CM

## 2020-10-21 MED ORDER — TRETINOIN 0.025 % EX CREA: TOPICAL_CREAM | Freq: Every evening | CUTANEOUS | 1 refills | Status: AC

## 2020-10-21 NOTE — Patient Instructions (Addendum)
Melanoma ABCDEs  Melanoma is the most dangerous type of skin cancer, and is the leading cause of death from skin disease.  You are more likely to develop melanoma if you:  Have light-colored skin, light-colored eyes, or red or blond hair  Spend a lot of time in the sun  Tan regularly, either outdoors or in a tanning bed  Have had blistering sunburns, especially during childhood  Have a close family member who has had a melanoma  Have atypical moles or large birthmarks  Early detection of melanoma is key since treatment is typically straightforward and cure rates are extremely high if we catch it early.   The first sign of melanoma is often a change in a mole or a new dark spot.  The ABCDE system is a way of remembering the signs of melanoma.  A for asymmetry:  The two halves do not match. B for border:  The edges of the growth are irregular. C for color:  A mixture of colors are present instead of an even brown color. D for diameter:  Melanomas are usually (but not always) greater than 63mm - the size of a pencil eraser. E for evolution:  The spot keeps changing in size, shape, and color.  Please check your skin once per month between visits. You can use a small mirror in front and a large mirror behind you to keep an eye on the back side or your body.   If you see any new or changing lesions before your next follow-up, please call to schedule a visit.  Please continue daily skin protection including broad spectrum sunscreen SPF 30+ to sun-exposed areas, reapplying every 2 hours as needed when you're outdoors.   Recommend taking Heliocare sun protection supplement daily in sunny weather for additional sun protection. For maximum protection on the sunniest days, you can take up to 2 capsules of regular Heliocare OR take 1 capsule of Heliocare Ultra. For prolonged exposure (such as a full day in the sun), you can repeat your dose of the supplement 4 hours after your first dose. Heliocare  can be purchased at [facility] or at [email or web address].    Favor chondrodermatitis nodularis helicis [facility]) at ear but will recheck Keep affected area of left ear covered with Vaseline and Hydrocele blister bandaids.   Topical retinoid medications like tretinoin/Retin-A, adapalene/Differin, tazarotene/Fabior, and Epiduo/Epiduo Forte can cause dryness and irritation when first started. Only apply a pea-sized amount to the entire affected area. Avoid applying it around the eyes, edges of mouth and creases at the nose. If you experience irritation, use a good moisturizer first and/or apply the medicine less often. If you are doing well with the medicine, you can increase how often you use it until you are applying every night. Be careful with sun protection while using this medication as it can make you sensitive to the sun. This medicine should not be used by pregnant women.

## 2020-10-21 NOTE — Progress Notes (Signed)
   New Patient Visit  Subjective  Peggy Nielsen is a 65 y.o. female who presents for the following: New Patient (Initial Visit) (Patient states she has not been here in 3 years. She denies family history or personal history of skin cancer. She has concerns with scabby spot on left ear that she noticed 2 months ago. She also has concerns with spot on left forearm she noticed several months ago.  She is also significantly bothered by acne at the face, present for years).  Objective  Well appearing patient in no apparent distress; mood and affect are within normal limits.  A focused examination was performed including face, bilateral arms, bilateral ears, left hand . Relevant physical exam findings are noted in the Assessment and Plan.  Objective  nose and face: 2 plus dilated open comedones at face  Objective  left 3rd finger: Open wound   Objective  Left Ear antihelix: Left ear antihelix with superficial ulceration  Assessment & Plan  Acne, unspecified acne type nose and face  Chronic condition with duration over one year. Condition is bothersome to patient. Currently flared.  consistent with Konrad Felix -Racouchot   Start Tretinoin 0.025 % cream apply topically at bedtime to affected areas of nose and face.   Recommend patient stops smoking   Topical retinoid medications like tretinoin/Retin-A, adapalene/Differin, tazarotene/Fabior, and Epiduo/Epiduo Forte can cause dryness and irritation when first started. Only apply a pea-sized amount to the entire affected area. Avoid applying it around the eyes, edges of mouth and creases at the nose. If you experience irritation, use a good moisturizer first and/or apply the medicine less often. If you are doing well with the medicine, you can increase how often you use it until you are applying every night. Be careful with sun protection while using this medication as it can make you sensitive to the sun. This medicine should not be used by  pregnant women.    Ordered Medications: tretinoin (RETIN-A) 0.025 % cream  Open wound left 3rd finger  S/p cut  Apply Vaseline and band aid daily until healed   Neoplasm of skin of ear Left Ear antihelix  Favor chondrodermatitis nodularis helicis but patient reports she rarely sleeps on that side. Discussed option of biopsy vs recheck in 1 month. She prefers to recheck. Recommend cover with blister bandaid changing every 3 days or apply vaseline and cover with regular bandaid every day until follow-up  Recommend not sleeping on this side    Seborrheic Keratoses Left forearm  - Stuck-on, waxy, tan-brown papules and plaques  - Discussed benign etiology and prognosis. - Observe - Call for any changes  Sebaceous Hyperplasia Left thumb  - Small yellow papule with a central dell - Benign-appearing today - Observe; Call for any changes  Actinic Damage - chronic, secondary to cumulative UV radiation exposure/sun exposure over time - diffuse scaly erythematous macules with underlying dyspigmentation - Recommend daily broad spectrum sunscreen SPF 30+ to sun-exposed areas, reapply every 2 hours as needed.  - Call for new or changing lesions.   Return in about 1 month (around 11/18/2020) for [facility] left ear follow up.  I, Ruthell Rummage, CMA, am acting as scribe for Forest Gleason, MD.  Documentation: I have reviewed the above documentation for accuracy and completeness, and I agree with the above.  Forest Gleason, MD

## 2020-10-25 NOTE — Addendum Note (Signed)
Addended by: Alfonso Patten on: 10/25/2020 10:19 AM   Modules accepted: Level of Service

## 2020-11-23 ENCOUNTER — Ambulatory Visit: Payer: BC Managed Care – PPO | Admitting: Dermatology

## 2020-11-23 ENCOUNTER — Encounter: Payer: Self-pay | Admitting: Dermatology

## 2020-11-23 ENCOUNTER — Other Ambulatory Visit: Payer: Self-pay

## 2020-11-23 DIAGNOSIS — D489 Neoplasm of uncertain behavior, unspecified: Secondary | ICD-10-CM

## 2020-11-23 DIAGNOSIS — D485 Neoplasm of uncertain behavior of skin: Secondary | ICD-10-CM

## 2020-11-23 NOTE — Progress Notes (Signed)
   Follow-Up Visit   Subjective  Peggy Nielsen is a 65 y.o. female who presents for the following: Follow-up (1 month f/u growth on the L ear, treating with vaseline and bandaid not much improvement ).   The following portions of the chart were reviewed this encounter and updated as appropriate:   Tobacco  Allergies  Meds  Problems  Med Hx  Surg Hx  Fam Hx      Review of Systems:  No other skin or systemic complaints except as noted in HPI or Assessment and Plan.  Objective  Well appearing patient in no apparent distress; mood and affect are within normal limits.  A focused examination was performed including face, L ear . Relevant physical exam findings are noted in the Assessment and Plan.  Objective  Left antihelix: 1.0 cm skin colored plaque with central crust without suspicious   dermoscopy   Assessment & Plan  Neoplasm of uncertain behavior Left antihelix  Destruction of lesion Complexity: simple   Destruction method: cryotherapy   Informed consent: discussed and consent obtained   Timeout:  patient name, date of birth, surgical site, and procedure verified Lesion destroyed using liquid nitrogen: Yes   Region frozen until ice ball extended beyond lesion: Yes   Outcome: patient tolerated procedure well with no complications   Post-procedure details: wound care instructions given    Favor chondrodermatitis nodularis helicis Symptomatic  Recommend LN2 treatment today, if it does not go away we will biopsy  Avoid sleeping on this ear or any pressure on this ear from phone   Return in about 6 weeks (around 01/04/2021) for [facility] .  I, Marye Round, CMA, am acting as scribe for Forest Gleason, MD .  Documentation: I have reviewed the above documentation for accuracy and completeness, and I agree with the above.  Forest Gleason, MD

## 2020-11-23 NOTE — Patient Instructions (Addendum)
Cryotherapy Aftercare  . Wash gently with soap and water everyday.   . Apply Vaseline and Band-Aid daily until healed.  

## 2020-12-17 ENCOUNTER — Other Ambulatory Visit: Payer: Self-pay | Admitting: Chiropractic Medicine

## 2020-12-17 ENCOUNTER — Ambulatory Visit
Admission: RE | Admit: 2020-12-17 | Discharge: 2020-12-17 | Disposition: A | Discharge status: Home / Self Care | Payer: BC Managed Care – PPO | Attending: Chiropractic Medicine | Admitting: Chiropractic Medicine

## 2020-12-17 ENCOUNTER — Other Ambulatory Visit: Payer: Self-pay

## 2020-12-17 ENCOUNTER — Ambulatory Visit
Admission: RE | Admit: 2020-12-17 | Discharge: 2020-12-17 | Disposition: A | Discharge status: Home / Self Care | Payer: BC Managed Care – PPO | Source: Ambulatory Visit | Attending: Chiropractic Medicine | Admitting: Chiropractic Medicine

## 2020-12-17 DIAGNOSIS — M9903 Segmental and somatic dysfunction of lumbar region: Secondary | ICD-10-CM

## 2020-12-28 ENCOUNTER — Ambulatory Visit: Payer: BC Managed Care – PPO | Admitting: Dermatology

## 2021-08-03 ENCOUNTER — Other Ambulatory Visit: Payer: Self-pay | Admitting: Family Medicine

## 2021-09-19 ENCOUNTER — Other Ambulatory Visit: Payer: Self-pay | Admitting: *Deleted

## 2021-09-19 DIAGNOSIS — F17210 Nicotine dependence, cigarettes, uncomplicated: Secondary | ICD-10-CM

## 2021-09-19 DIAGNOSIS — Z87891 Personal history of nicotine dependence: Secondary | ICD-10-CM

## 2021-10-10 ENCOUNTER — Encounter: Payer: BC Managed Care – PPO | Admitting: Acute Care

## 2021-10-14 ENCOUNTER — Ambulatory Visit: Payer: BC Managed Care – PPO

## 2021-11-30 ENCOUNTER — Other Ambulatory Visit: Payer: Self-pay

## 2021-11-30 ENCOUNTER — Emergency Department: Payer: BC Managed Care – PPO

## 2021-11-30 ENCOUNTER — Emergency Department
Admission: EM | Admit: 2021-11-30 | Discharge: 2021-12-01 | Disposition: A | Discharge status: Home / Self Care | Payer: BC Managed Care – PPO | Attending: Emergency Medicine | Admitting: Emergency Medicine

## 2021-11-30 DIAGNOSIS — Z7982 Long term (current) use of aspirin: Secondary | ICD-10-CM | POA: Diagnosis not present

## 2021-11-30 DIAGNOSIS — F10129 Alcohol abuse with intoxication, unspecified: Secondary | ICD-10-CM | POA: Insufficient documentation

## 2021-11-30 DIAGNOSIS — Y908 Blood alcohol level of 240 mg/100 ml or more: Secondary | ICD-10-CM | POA: Insufficient documentation

## 2021-11-30 DIAGNOSIS — W19XXXA Unspecified fall, initial encounter: Secondary | ICD-10-CM

## 2021-11-30 DIAGNOSIS — W1839XA Other fall on same level, initial encounter: Secondary | ICD-10-CM | POA: Insufficient documentation

## 2021-11-30 DIAGNOSIS — R519 Headache, unspecified: Secondary | ICD-10-CM | POA: Diagnosis not present

## 2021-11-30 DIAGNOSIS — F10929 Alcohol use, unspecified with intoxication, unspecified: Secondary | ICD-10-CM

## 2021-11-30 NOTE — ED Triage Notes (Signed)
Pt comes from home via [facility]. Per EMS. Pt has been drinking tonight and has fallen multiples. Takes blood thinner at home, Plavix. ?

## 2021-11-30 NOTE — ED Provider Notes (Signed)
? ?[facility] ?Provider Note ? ? ? Event Date/Time  ? First MD Initiated Contact with Patient 11/30/21 2345   ?  (approximate) ? ? ?History  ? ?Alcohol Intoxication ? ? ?HPI ? ?Peggy Nielsen is a 66 y.o. female with a history of alcohol abuse who presents from home for multiple falls.  According to EMS patient has a history of alcohol abuse.  Drank a large amount of alcohol today.  Had several falls.  Patient is clearly intoxicated.  She does report being on a blood thinner however review of her medical record shows no blood thinners other than an aspirin.  Patient is unable to provide any history about the falls.  She does report drinking alcohol today.  She denies headache, neck pain, back pain, extremity pain, chest pain, abdominal pain.  Husband called 911 because patient was being very aggressive with him at home. ?  ? ? ?Past Medical History:  ?Diagnosis Date  ? Breast screening, unspecified   ? Murmur   ? Personal history of tobacco use, presenting hazards to health   ? Special screening for malignant neoplasms, colon   ? ? ?Past Surgical History:  ?Procedure Laterality Date  ? BREAST BIOPSY Left 10-28-01  ? COLONOSCOPY  2011  ? Dr. Jamal Collin  ? TONSILLECTOMY    ? TUBAL LIGATION    ? ? ? ?Physical Exam  ? ?Triage Vital Signs: ? ?Most recent vital signs: ?Vitals:  ? 12/01/21 0500 12/01/21 0630  ?BP: (!) 115/58 (!) 129/55  ?Pulse: (!) 58 68  ?Resp: 20 20  ?Temp:    ?SpO2: 97% 97%  ? ? ?Full spinal precautions maintained throughout the trauma exam. ?Constitutional: Alert and oriented to self and place. No acute distress. clinically intoxicated. ?HEENT ?Head: Normocephalic and atraumatic. ?Face: No facial bony tenderness. Stable midface ?Ears: No hemotympanum bilaterally. No Battle sign ?Eyes: No eye injury. PERRL. No raccoon eyes ?Nose: Nontender. No epistaxis. No rhinorrhea ?Mouth/Throat: Mucous membranes are moist. No oropharyngeal blood. No dental injury. Airway patent without stridor. Normal  voice. ?Neck: no C-collar. No midline c-spine tenderness.  ?Cardiovascular: Normal rate, regular rhythm. Normal and symmetric distal pulses are present in all extremities. ?Pulmonary/Chest: Chest wall is stable and nontender to palpation/compression. Normal respiratory effort. Breath sounds are normal. No crepitus.  ?Abdominal: Soft, nontender, non distended. ?Musculoskeletal: Nontender with normal full range of motion in all extremities. No deformities. No thoracic or lumbar midline spinal tenderness. Pelvis is stable. ?Skin: Skin is warm, dry and intact. No abrasions or contutions. ?Psychiatric: Speech and behavior are appropriate. ?Neurological: Normal speech and language. Moves all extremities to command. No gross focal neurologic deficits are appreciated. ? ?Glascow Coma Score: ?4 - Opens eyes on own ?6 - Follows simple motor commands ?5 - Alert and oriented ?GCS: 15 ? ? ?ED Results / Procedures / Treatments  ? ?Labs ?(all labs ordered are listed, but only abnormal results are displayed) ?Labs Reviewed  ?CBC WITH DIFFERENTIAL/PLATELET - Abnormal; Notable for the following components:  ?    Result Value  ? WBC 12.1 (*)   ? RBC 3.75 (*)   ? MCH 34.1 (*)   ? All other components within normal limits  ?COMPREHENSIVE METABOLIC PANEL - Abnormal; Notable for the following components:  ? Sodium 134 (*)   ? Potassium 3.2 (*)   ? CO2 20 (*)   ? Glucose, Bld 122 (*)   ? All other components within normal limits  ?ETHANOL - Abnormal; Notable for the following  components:  ? Alcohol, Ethyl (B) 306 (*)   ? All other components within normal limits  ?URINE DRUG SCREEN, QUALITATIVE ([facility] ONLY)  ? ? ? ?EKG ? ?ED ECG REPORT ?I, Rudene Re, the attending physician, personally viewed and interpreted this ECG. ? ?Sinus rhythm, rate of 70, normal intervals, normal axis, no ST elevations or depressions. ? ?RADIOLOGY ?I, Rudene Re, attending MD, have personally viewed and interpreted the images obtained during this  visit as below: ? ?CT head/ c-spine: negative for trauma ? ? ?___________________________________________________ ?Interpretation by Radiologist:  ?CT Head Wo Contrast ? ?Result Date: 12/01/2021 ?CLINICAL DATA:  Head trauma, minor multiple falls.  Plavix. EXAM: CT HEAD WITHOUT CONTRAST TECHNIQUE: Contiguous axial images were obtained from the base of the skull through the vertex without intravenous contrast. RADIATION DOSE REDUCTION: This exam was performed according to the departmental dose-optimization program which includes automated exposure control, adjustment of the mA and/or kV according to patient size and/or use of iterative reconstruction technique. COMPARISON:  MRI 07/05/2016 FINDINGS: Brain: No evidence of acute infarction, hemorrhage, hydrocephalus, extra-axial collection or mass lesion/mass effect. Mild cerebral atrophy. Vascular: Mild intracranial arterial vascular calcifications. Skull: Normal. Negative for fracture or focal lesion. Sinuses/Orbits: No acute finding. Other: None. IMPRESSION: No acute intracranial abnormalities. Electronically Signed   By: Lucienne Capers M.D.   On: 12/01/2021 02:19  ? ?CT Cervical Spine Wo Contrast ? ?Result Date: 12/01/2021 ?CLINICAL DATA:  Neck trauma, intoxicated or obtained it. Multiple falls tonight. Blood thinners. EXAM: CT CERVICAL SPINE WITHOUT CONTRAST TECHNIQUE: Multidetector CT imaging of the cervical spine was performed without intravenous contrast. Multiplanar CT image reconstructions were also generated. RADIATION DOSE REDUCTION: This exam was performed according to the departmental dose-optimization program which includes automated exposure control, adjustment of the mA and/or kV according to patient size and/or use of iterative reconstruction technique. COMPARISON:  None. FINDINGS: Alignment: Reversal of the usual cervical lordosis. This is likely positional but may indicate muscle spasm. No anterior subluxations. Normal alignment of the facet joints.  Skull base and vertebrae: No acute fracture. No primary bone lesion or focal pathologic process. Soft tissues and spinal canal: No prevertebral fluid or swelling. No visible canal hematoma. Vascular calcifications. Disc levels: Degenerative changes with disc space narrowing and endplate osteophyte formation throughout, most prominent at C4-5, C5-6, and C6-7 levels. Degenerative changes in the facet joints. Upper chest: Emphysematous changes in the lung apices. Other: None. IMPRESSION: Nonspecific reversal of the usual cervical lordosis. No acute displaced fractures identified. Degenerative changes. Electronically Signed   By: Lucienne Capers M.D.   On: 12/01/2021 02:23   ? ? ? ?PROCEDURES: ? ?Critical Care performed: No ? ?Procedures ? ? ? ?IMPRESSION / MDM / ASSESSMENT AND PLAN / ED COURSE  ?I reviewed the triage vital signs and the nursing notes. ? ? 66 y.o. female with a history of alcohol abuse who presents from home for multiple falls in the setting of alcohol intoxication.  Patient is clinically intoxicated on arrival.  Denies any pain.  On exam there is no signs of trauma with full painless range of motion of all joints, no bruising, no midline spine tenderness, head is atraumatic, chest and abdomen are nontender with no signs of trauma.  Since patient is intoxicated we will get a CT of the head and cervical spine.  We will get basic blood work to rule out any significant electrolyte derangements, alcoholic ketoacidosis, acute kidney injury.  Will give IV fluids.  We will monitor patient closely on telemetry  for any signs of respiratory depression or complicated withdrawals. ? ?MEDICATIONS GIVEN IN ED: ?Medications - No data to display ? ? ?ED COURSE: CT head and C-spine with no signs of traumatic injury.  Blood work showing alcohol level of 306 with no signs of significant electrolyte derangements or alcoholic ketoacidosis.  UDS is negative.  Patient has been sleeping comfortably throughout the night.   Awaiting for a sober ride in the morning.  Several attempts to reach her husband overnight were unsuccessful.  We will try again in the morning.  No signs of complicated withdrawals at this time.  No indication

## 2021-12-01 LAB — URINE DRUG SCREEN, QUALITATIVE (ARMC ONLY)
Amphetamines, Ur Screen: NOT DETECTED
Barbiturates, Ur Screen: NOT DETECTED
Benzodiazepine, Ur Scrn: NOT DETECTED
Cannabinoid 50 Ng, Ur ~~LOC~~: NOT DETECTED
Cocaine Metabolite,Ur ~~LOC~~: NOT DETECTED
MDMA (Ecstasy)Ur Screen: NOT DETECTED
Methadone Scn, Ur: NOT DETECTED
Opiate, Ur Screen: NOT DETECTED
Phencyclidine (PCP) Ur S: NOT DETECTED
Tricyclic, Ur Screen: NOT DETECTED

## 2021-12-01 LAB — CBC WITH DIFFERENTIAL/PLATELET
Abs Immature Granulocytes: 0.07 10*3/uL (ref 0.00–0.07)
Basophils Absolute: 0.1 10*3/uL (ref 0.0–0.1)
Basophils Relative: 1 %
Eosinophils Absolute: 0.2 10*3/uL (ref 0.0–0.5)
Eosinophils Relative: 1 %
HCT: 37.1 % (ref 36.0–46.0)
Hemoglobin: 12.8 g/dL (ref 12.0–15.0)
Immature Granulocytes: 1 %
Lymphocytes Relative: 26 %
Lymphs Abs: 3.1 10*3/uL (ref 0.7–4.0)
MCH: 34.1 pg — ABNORMAL HIGH (ref 26.0–34.0)
MCHC: 34.5 g/dL (ref 30.0–36.0)
MCV: 98.9 fL (ref 80.0–100.0)
Monocytes Absolute: 1 10*3/uL (ref 0.1–1.0)
Monocytes Relative: 9 %
Neutro Abs: 7.7 10*3/uL (ref 1.7–7.7)
Neutrophils Relative %: 62 %
Platelets: 291 10*3/uL (ref 150–400)
RBC: 3.75 MIL/uL — ABNORMAL LOW (ref 3.87–5.11)
RDW: 12.2 % (ref 11.5–15.5)
WBC: 12.1 10*3/uL — ABNORMAL HIGH (ref 4.0–10.5)
nRBC: 0 % (ref 0.0–0.2)

## 2021-12-01 LAB — COMPREHENSIVE METABOLIC PANEL
ALT: 27 U/L (ref 0–44)
AST: 35 U/L (ref 15–41)
Albumin: 4.6 g/dL (ref 3.5–5.0)
Alkaline Phosphatase: 56 U/L (ref 38–126)
Anion gap: 13 (ref 5–15)
BUN: 9 mg/dL (ref 8–23)
CO2: 20 mmol/L — ABNORMAL LOW (ref 22–32)
Calcium: 9.5 mg/dL (ref 8.9–10.3)
Chloride: 101 mmol/L (ref 98–111)
Creatinine, Ser: 0.53 mg/dL (ref 0.44–1.00)
GFR, Estimated: >60 mL/min (ref >60)
Glucose, Bld: 122 mg/dL — ABNORMAL HIGH (ref 70–99)
Potassium: 3.2 mmol/L — ABNORMAL LOW (ref 3.5–5.1)
Sodium: 134 mmol/L — ABNORMAL LOW (ref 135–145)
Total Bilirubin: 0.5 mg/dL (ref 0.3–1.2)
Total Protein: 7.7 g/dL (ref 6.5–8.1)

## 2021-12-01 LAB — ETHANOL: Alcohol, Ethyl (B): 306 mg/dL (ref <10)

## 2021-12-01 NOTE — ED Notes (Signed)
Pt given phone to call friend and husband, no answer.  ?

## 2021-12-01 NOTE — Discharge Instructions (Addendum)

## 2021-12-01 NOTE — ED Notes (Signed)
Pt given water 

## 2021-12-01 NOTE — ED Notes (Signed)
Pt awake, this RN left message for husband and attempted to call another number for pt's friend, but the number was incorrect. Will attempt again.  ?

## 2021-12-01 NOTE — ED Notes (Signed)
Pt taken to CT.

## 2021-12-01 NOTE — ED Provider Notes (Signed)
I assumed care of this patient approximately 0 700.  Please see outgoing providers note for full details regarding patient's initial evaluation assessment.  In brief patient presents for evaluation intoxicated from her home.  She has a history of multiple falls and given she is intoxicated CT head and C-spine obtained which are unremarkable.  Plan is to allow patient to metabolize and likely discharge.  My assessment patient seems clinically sober.  She is denying any acute concerns.  She does endorse drinking alcohol last night.  She states she drinks fairly regularly although has never tried to cut down on this using any medical assistance or speaking with her PCP.  Discussed at length the importance of this and working with her PCP to see if something like Librium would be appropriate.  She does not seem to be in withdrawal at this time.  She is denying any other acute concerns or complaints and states she feels safe going home.  At this point I do not think she requires further observation emergency room and can be discharged.  Discharged in stable condition.  Strict return precautions advised and discussed ?  ?Lucrezia Starch, MD ?12/01/21 [phone] ? ?

## 2021-12-01 NOTE — ED Notes (Signed)
Pt sleeping at this time.

## 2021-12-01 NOTE — ED Notes (Signed)
Pt attempted to get out of bed multiple times. Pt educated on the importance of staying in bed. Posey alarm in place ?

## 2021-12-01 NOTE — ED Notes (Signed)
Pt states her friend Hassan Rowan was called and is coming to get her. Pt walked to toilet, gait shaky. Pt taken to lobby in [place],, friend not arrived, pt instructed not to get out of chair without assistance. First nurse aware. Pt placed near first nurse, first nurse notified that pt's ride is on the way.  ?

## 2021-12-01 NOTE — ED Notes (Signed)
Pt sleeping. NAD Noted, respirations even and unlabored ?

## 2022-03-13 IMAGING — CR DG LUMBAR SPINE COMPLETE 4+V
1 series · 5 of 5 positions shown · non-contrast
Comparison: None.

CLINICAL DATA: Lower back pain.

EXAM:
LUMBAR SPINE - COMPLETE 4+ VIEW

[Series 1: dg lumbar spine complete 4 +v · 0.14mm/px · 5 of 5 slices shown]
[im 1/5]
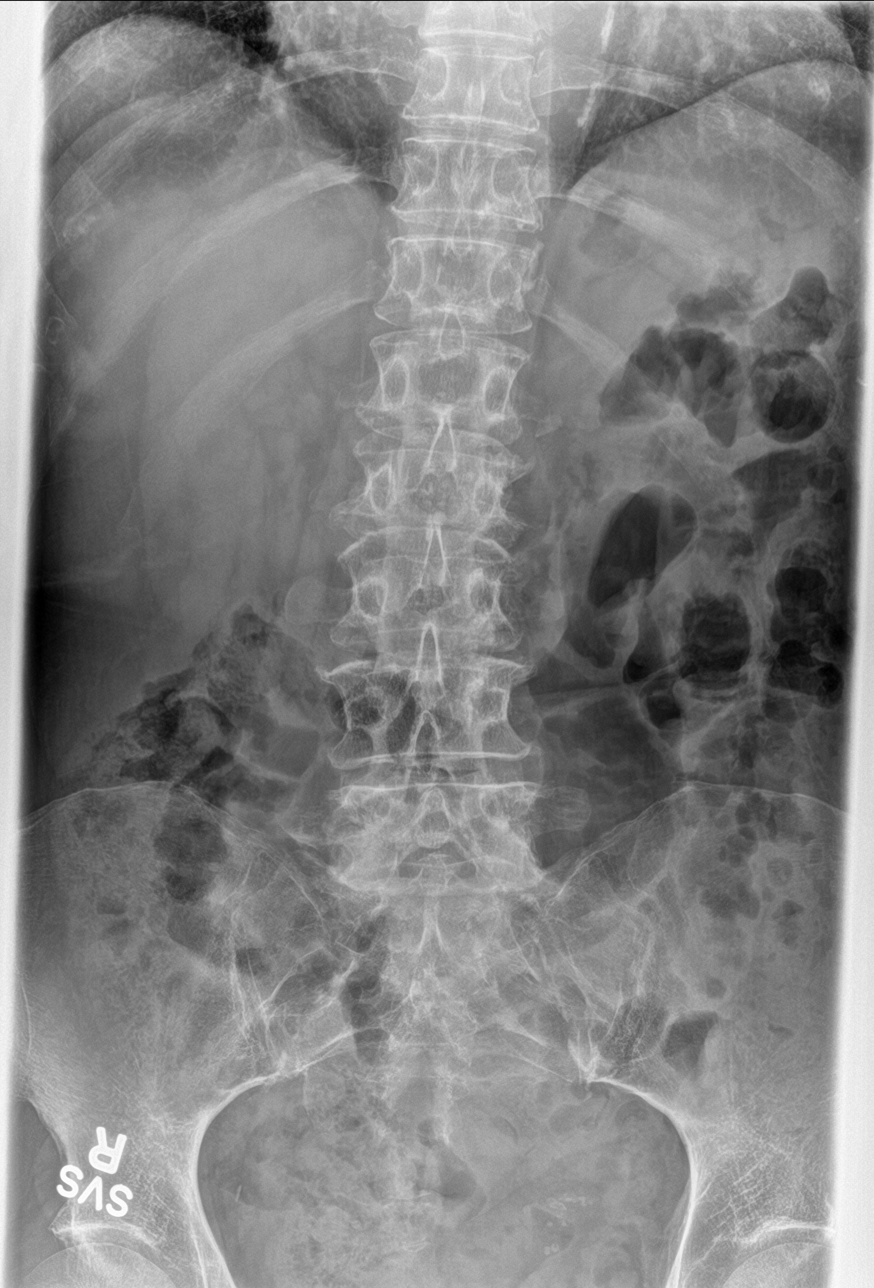
[im 2/5]
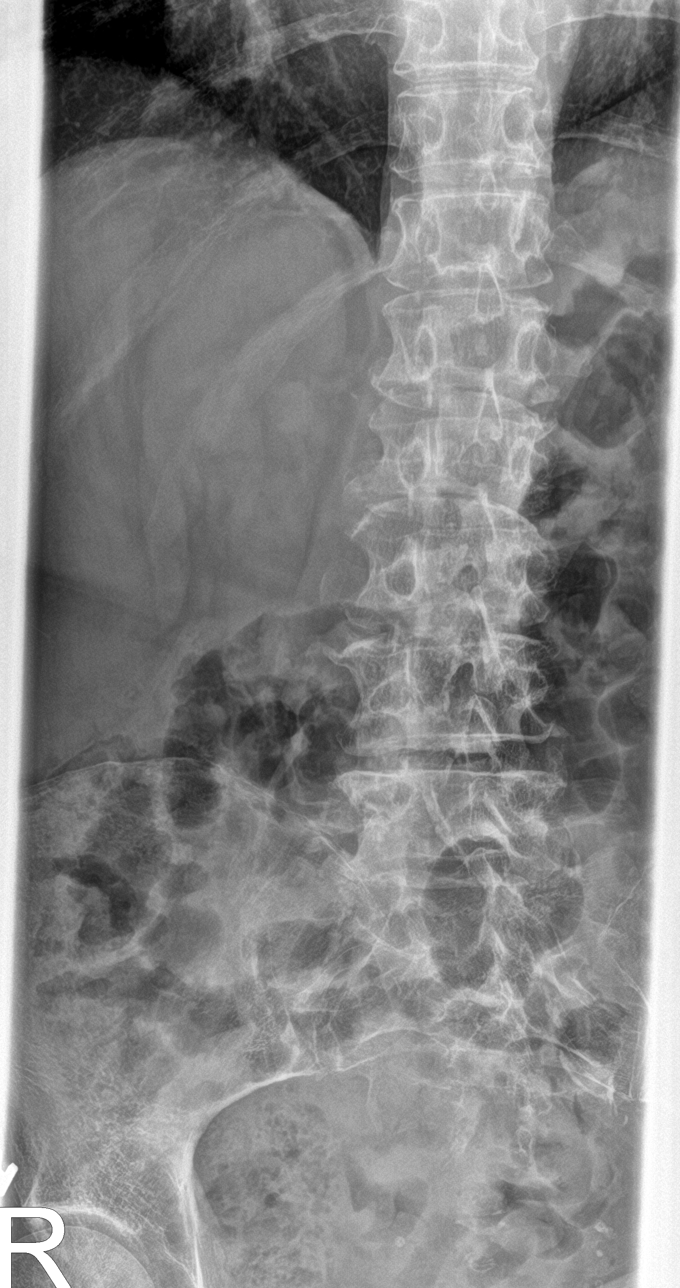
[im 3/5]
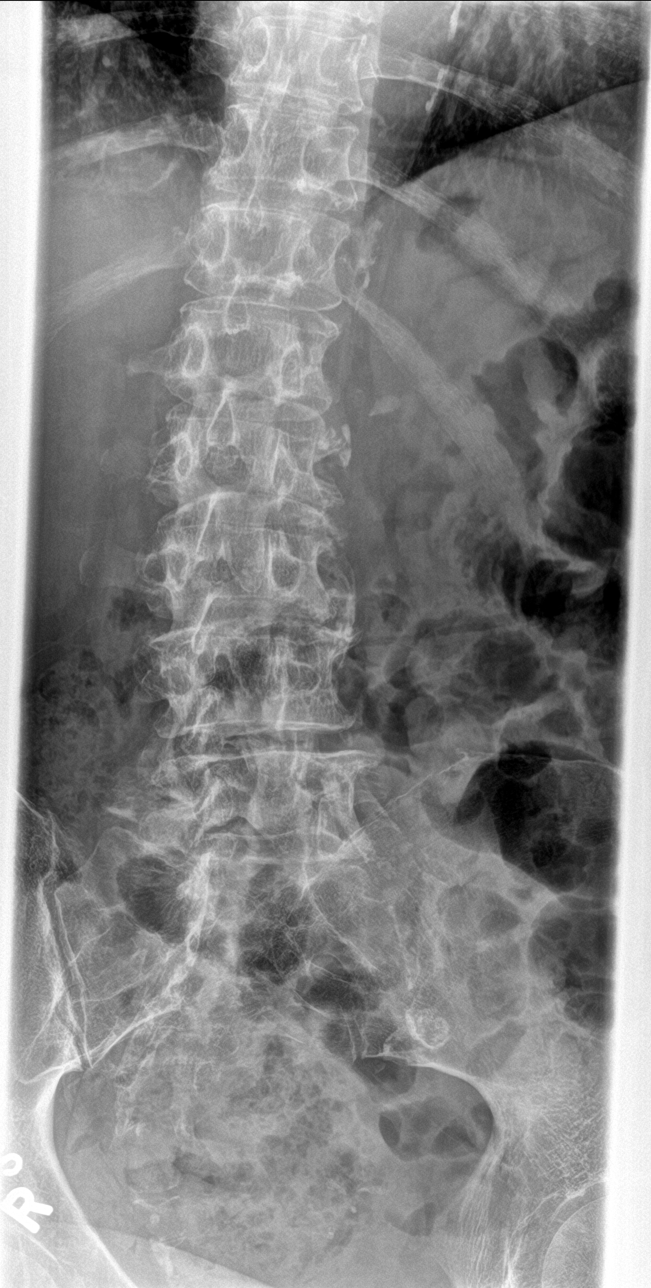
[im 4/5]
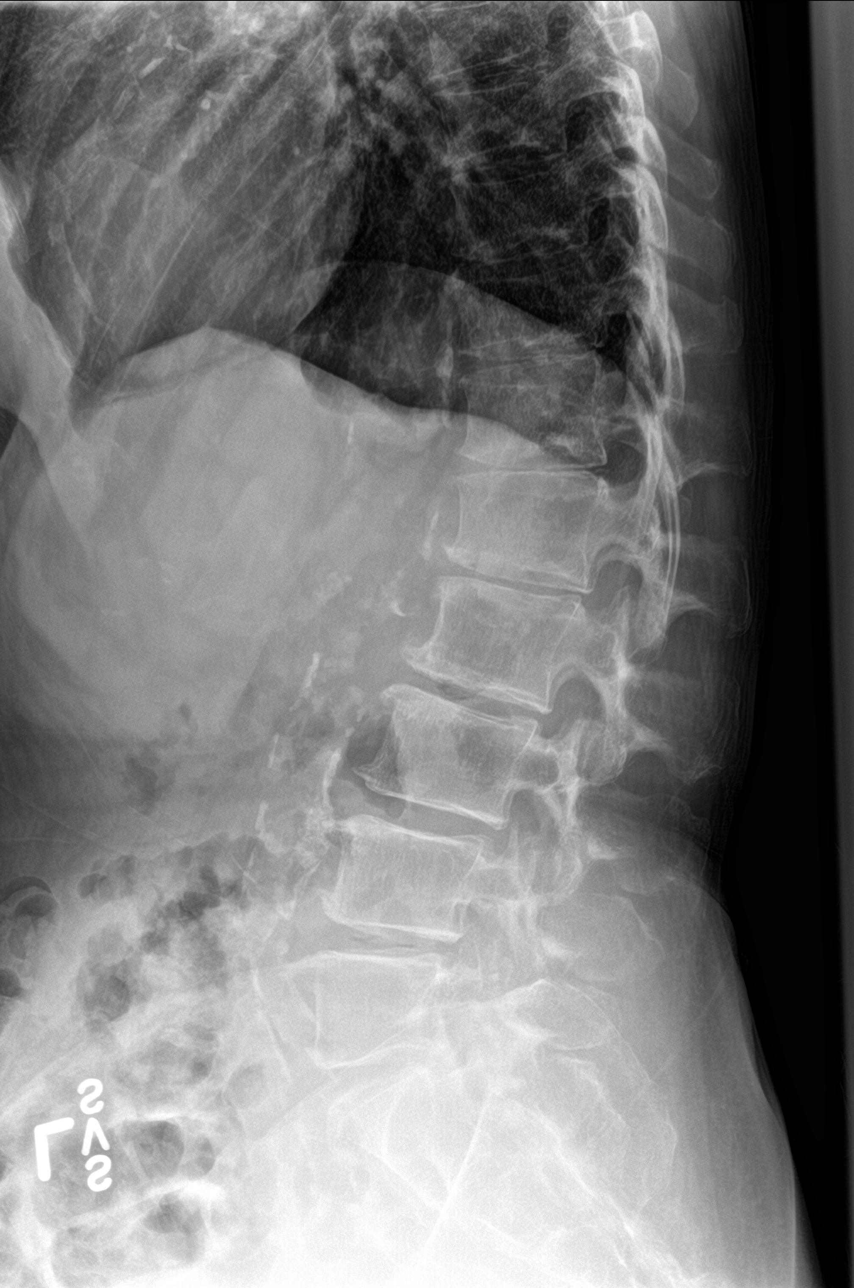
[im 5/5]
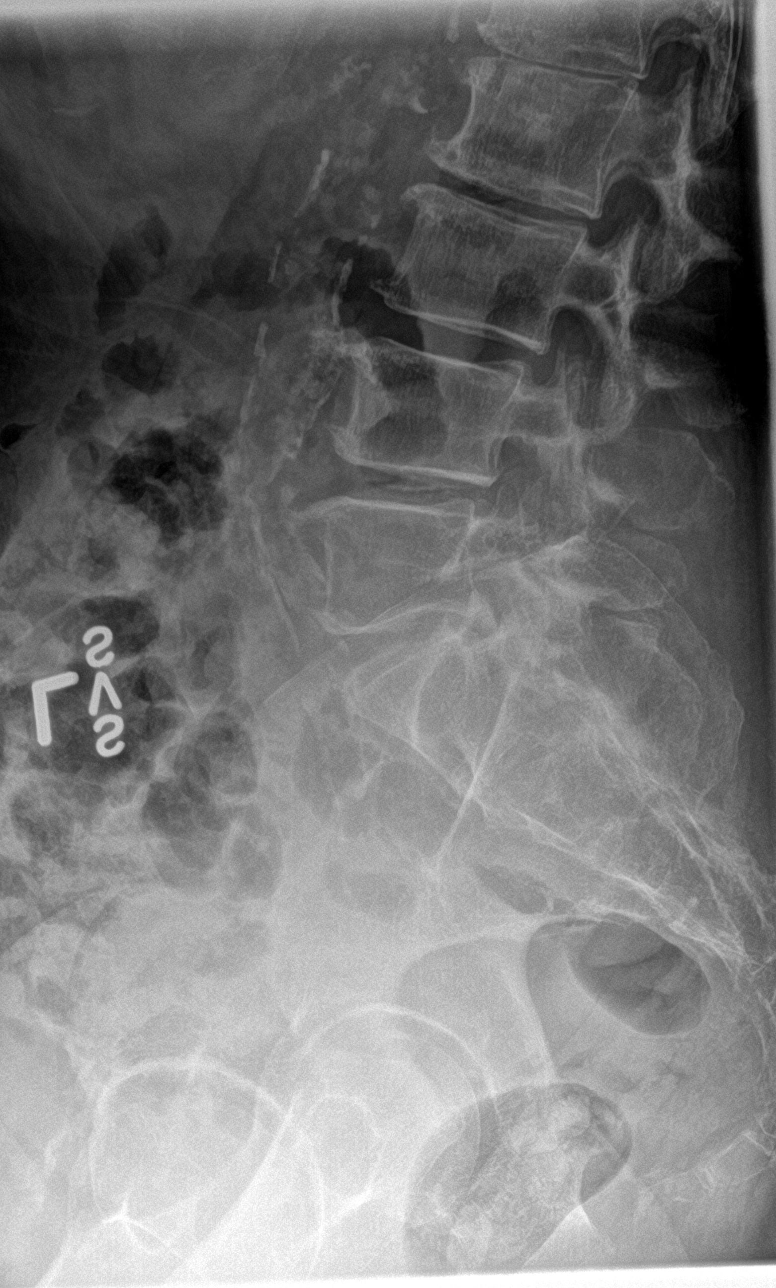

[5 of 5 positions shown; findings below may reference images not displayed]

FINDINGS: There is no evidence of lumbar spine fracture. 1 mm to 2 mm
retrolisthesis of the L3 vertebral body is seen on L4. 1 mm to 2 mm
retrolisthesis of the L4 vertebral body on L5 is also noted mild to
moderate severity multilevel endplate sclerosis is seen. This is
most prominent at the level of L2-L3. Mild multilevel anterior
osteophyte formation is also seen. Mild intervertebral disc space
narrowing is noted at the levels of L1-L2 and L2-L3.
IMPRESSION: Moderate severity multilevel degenerative changes with mild
retrolisthesis of the L3 vertebral body and L4 vertebral bodies.

## 2022-09-22 ENCOUNTER — Encounter: Payer: Self-pay | Admitting: *Deleted

## 2022-12-04 ENCOUNTER — Other Ambulatory Visit: Payer: Self-pay | Admitting: Gastroenterology

## 2022-12-04 DIAGNOSIS — K625 Hemorrhage of anus and rectum: Secondary | ICD-10-CM

## 2022-12-19 ENCOUNTER — Ambulatory Visit
Admission: RE | Admit: 2022-12-19 | Discharge: 2022-12-19 | Disposition: A | Discharge status: Home / Self Care | Payer: BC Managed Care – PPO | Source: Ambulatory Visit | Attending: Gastroenterology | Admitting: Gastroenterology

## 2022-12-19 DIAGNOSIS — K625 Hemorrhage of anus and rectum: Secondary | ICD-10-CM

## 2022-12-19 MED ORDER — IOPAMIDOL (ISOVUE-300) INJECTION 61%
100.0000 mL | Freq: Once | INTRAVENOUS | Status: AC | PRN
  Administered 2022-12-19: 100 mL via INTRAVENOUS

## 2023-08-20 ENCOUNTER — Encounter (INDEPENDENT_AMBULATORY_CARE_PROVIDER_SITE_OTHER): Payer: Self-pay | Admitting: Vascular Surgery

## 2023-08-30 ENCOUNTER — Encounter (INDEPENDENT_AMBULATORY_CARE_PROVIDER_SITE_OTHER): Payer: BLUE CROSS/BLUE SHIELD | Admitting: Vascular Surgery

## 2023-09-06 ENCOUNTER — Encounter (INDEPENDENT_AMBULATORY_CARE_PROVIDER_SITE_OTHER): Payer: BLUE CROSS/BLUE SHIELD | Admitting: Vascular Surgery

## 2023-09-25 ENCOUNTER — Encounter (INDEPENDENT_AMBULATORY_CARE_PROVIDER_SITE_OTHER): Payer: Self-pay | Admitting: Nurse Practitioner

## 2023-09-25 ENCOUNTER — Ambulatory Visit (INDEPENDENT_AMBULATORY_CARE_PROVIDER_SITE_OTHER): Payer: BC Managed Care – PPO | Admitting: Nurse Practitioner

## 2023-09-25 VITALS — BP 124/75 | HR 71 | Resp 18 | Ht 66.0 in | Wt 137.8 lb

## 2023-09-25 DIAGNOSIS — N9489 Other specified conditions associated with female genital organs and menstrual cycle: Secondary | ICD-10-CM

## 2023-09-26 NOTE — Progress Notes (Signed)
Subjective:    Patient ID: Peggy Nielsen, female    DOB: 01/11/1956, 68 y.o.   MRN: [id number] Chief Complaint  Patient presents with   New Patient (Initial Visit)    np. consult. possible pelvic congestion syndrome. lindley, cheryl.    Peggy Nielsen is a 68 year old female who presents today for evaluation of abdominal discomfort.  She notes that the issue is more so that she has bloating.  This bloating has been present for the last several years since the beginning of COVID.  She notes that at sometimes the bloating is uncomfortable and budesonide she was recently given by GI is helpful for this.  There is a almost fullness sensation that is only relieved with a good bowel movement.  In the midst of workup she recently underwent a transvaginal ultrasound which shows simple uterine dilation which is suggestive of possible pelvic congestion syndrome.    Review of Systems  Gastrointestinal:  Positive for abdominal distention.  All other systems reviewed and are negative.      Objective:   Physical Exam Vitals reviewed.  HENT:     Head: Normocephalic.  Cardiovascular:     Rate and Rhythm: Normal rate.  Pulmonary:     Effort: Pulmonary effort is normal.  Abdominal:     General: There is distension.  Skin:    General: Skin is warm and dry.  Neurological:     Mental Status: She is alert and oriented to person, place, and time.  Psychiatric:        Mood and Affect: Mood normal.        Behavior: Behavior normal.        Thought Content: Thought content normal.        Judgment: Judgment normal.     BP 124/75   Pulse 71   Resp 18   Ht 5\' 6"  (1.676 m)   Wt 137 lb 12.8 oz (62.5 kg)   BMI 22.24 kg/m   Past Medical History:  Diagnosis Date   Breast screening, unspecified    Murmur    Personal history of tobacco use, presenting hazards to health    Special screening for malignant neoplasms, colon     Social History   Socioeconomic History   Marital status: Married     Spouse name: Not on file   Number of children: Not on file   Years of education: Not on file   Highest education level: Not on file  Occupational History   Not on file  Tobacco Use   Smoking status: Every Day    Current packs/day: 1.00    Average packs/day: 1 pack/day for 20.0 years (20.0 ttl pk-yrs)    Types: Cigarettes   Smokeless tobacco: Never  Substance and Sexual Activity   Alcohol use: Yes    Comment: occasionally   Drug use: No   Sexual activity: Not on file  Other Topics Concern   Not on file  Social History Narrative   Not on file   Social Drivers of Health   Financial Resource Strain: Not on file  Food Insecurity: Not on file  Transportation Needs: Not on file  Physical Activity: Not on file  Stress: Not on file  Social Connections: Not on file  Intimate Partner Violence: Not on file    Past Surgical History:  Procedure Laterality Date   BREAST BIOPSY Left 10-28-01   COLONOSCOPY  2011   Dr. Evette Cristal   TONSILLECTOMY     TUBAL LIGATION  Family History  Adopted: Yes  Problem Relation Age of Onset   Hypertension Mother    Hyperlipidemia Mother     Allergies  Allergen Reactions   Ceclor [Cefaclor] Swelling       Latest Ref Rng & Units 11/30/2021   11:48 PM  CBC  WBC 4.0 - 10.5 K/uL 12.1   Hemoglobin 12.0 - 15.0 g/dL 16.1   Hematocrit 09.6 - 46.0 % 37.1   Platelets 150 - 400 K/uL 291       CMP     Component Value Date/Time   NA 134 (L) 11/30/2021 2348   K 3.2 (L) 11/30/2021 2348   CL 101 11/30/2021 2348   CO2 20 (L) 11/30/2021 2348   GLUCOSE 122 (H) 11/30/2021 2348   BUN 9 11/30/2021 2348   CREATININE 0.53 11/30/2021 2348   CALCIUM 9.5 11/30/2021 2348   PROT 7.7 11/30/2021 2348   ALBUMIN 4.6 11/30/2021 2348   AST 35 11/30/2021 2348   ALT 27 11/30/2021 2348   ALKPHOS 56 11/30/2021 2348   BILITOT 0.5 11/30/2021 2348   GFRNONAA >60 11/30/2021 2348     No results found.     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  In order to fully evaluate the pelvic venous anatomy she will need to undergo a CT venogram in order to determine if there is the presence of pelvic venous congestion as well as to plan any possible intervention.  We discussed intervention will likely be in the form of some sort of embolization.  Will have patient follow-up post CT scan to discuss next steps. - CT VENOGRAM ABD/PEL; Future   Current Outpatient Medications on File Prior to Visit  Medication Sig Dispense Refill   ALPRAZolam (XANAX) 0.5 MG tablet Take 0.5 mg by mouth 2 (two) times daily.      amLODipine (NORVASC) 10 MG tablet Take 10 mg by mouth daily.  1   aspirin EC 81 MG tablet Take 81 mg by mouth daily.      Cholecalciferol (VITAMIN D3) 1000 units CAPS Take by mouth daily.      CRESTOR 20 MG tablet Take 1 tablet by mouth daily.     lisinopril (PRINIVIL,ZESTRIL) 40 MG tablet Take 40 mg by mouth daily.  1   meloxicam (MOBIC) 15 MG tablet Take 15 mg by mouth as needed.     omeprazole (PRILOSEC) 40 MG capsule Take 40 mg by mouth as needed.     primidone (MYSOLINE) 50 MG tablet Take 50 mg by mouth 2 (two) times daily.      trazodone (DESYREL) 300 MG tablet Take 300 mg by mouth at bedtime.     traZODone (DESYREL) 50 MG tablet Take 50-100 mg by mouth at bedtime.     vitamin B-12 (CYANOCOBALAMIN) 1000 MCG tablet Take 1,000 mcg by mouth daily.      doxycycline (PERIOSTAT) 20 MG tablet Take 20 mg by mouth 2 (two) times daily.     LINZESS 72 MCG capsule Take 72 mcg by mouth daily.     No current facility-administered medications on file prior to visit.    There are no Patient Instructions on file for this visit. No follow-ups on file.   Georgiana Spinner, NP

## 2023-10-09 ENCOUNTER — Ambulatory Visit: Admission: RE | Admit: 2023-10-09 | Payer: BC Managed Care – PPO | Source: Ambulatory Visit

## 2023-10-16 ENCOUNTER — Ambulatory Visit
Admission: RE | Admit: 2023-10-16 | Discharge: 2023-10-16 | Disposition: A | Discharge status: Home / Self Care | Payer: BC Managed Care – PPO | Source: Ambulatory Visit | Attending: Nurse Practitioner | Admitting: Nurse Practitioner

## 2023-10-16 DIAGNOSIS — N9489 Other specified conditions associated with female genital organs and menstrual cycle: Secondary | ICD-10-CM | POA: Diagnosis present

## 2023-10-16 MED ORDER — IOHEXOL 350 MG/ML SOLN
100.0000 mL | Freq: Once | INTRAVENOUS | Status: AC | PRN
  Administered 2023-10-16: 100 mL via INTRAVENOUS

## 2023-10-23 NOTE — Progress Notes (Signed)
 She should come review CT with JD

## 2023-11-09 ENCOUNTER — Ambulatory Visit (INDEPENDENT_AMBULATORY_CARE_PROVIDER_SITE_OTHER): Payer: BC Managed Care – PPO | Admitting: Vascular Surgery

## 2023-11-09 ENCOUNTER — Encounter (INDEPENDENT_AMBULATORY_CARE_PROVIDER_SITE_OTHER): Payer: Self-pay | Admitting: Vascular Surgery

## 2023-11-09 VITALS — BP 135/79 | HR 64 | Resp 18 | Ht 63.0 in | Wt 135.8 lb

## 2023-11-09 DIAGNOSIS — N9489 Other specified conditions associated with female genital organs and menstrual cycle: Secondary | ICD-10-CM

## 2023-11-09 DIAGNOSIS — E785 Hyperlipidemia, unspecified: Secondary | ICD-10-CM | POA: Diagnosis not present

## 2023-11-09 DIAGNOSIS — I10 Essential (primary) hypertension: Secondary | ICD-10-CM | POA: Diagnosis not present

## 2023-11-09 NOTE — Progress Notes (Signed)
 MRN : [id number]  Peggy Nielsen is a 68 y.o. (12-02-1955) female who presents with chief complaint of  Chief Complaint  Patient presents with   Follow-up    CT results  .  History of Present Illness: Patient returns today in follow up of her abdominal bloating.  Her symptoms are similar to slightly worse than what they were when we saw her in the office back in January.  No major changes.  She still complains predominantly of abdominal bloating and fullness.  This affects her lower abdomen the most.  She has undergone a CT venogram which I have independently reviewed.  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  Current Outpatient Medications  Medication Sig Dispense Refill   ALPRAZolam (XANAX) 0.5 MG tablet Take 0.5 mg by mouth 2 (two) times daily.      amLODipine (NORVASC) 10 MG tablet Take 10 mg by mouth daily.  1   aspirin EC 81 MG tablet Take 81 mg by mouth daily.      Cholecalciferol (VITAMIN D3) 1000 units CAPS Take by mouth daily.      CRESTOR 20 MG tablet Take 1 tablet by mouth daily.     doxycycline (PERIOSTAT) 20 MG tablet Take 20 mg by mouth 2 (two) times daily.     LINZESS 72 MCG capsule Take 72 mcg by mouth daily.     lisinopril (PRINIVIL,ZESTRIL) 40 MG tablet Take 40 mg by mouth daily.  1   meloxicam (MOBIC) 15 MG tablet Take 15 mg by mouth as needed.     omeprazole (PRILOSEC) 40 MG capsule Take 40 mg by mouth as needed.     primidone (MYSOLINE) 50 MG tablet Take 50 mg by mouth 2 (two) times daily.      trazodone (DESYREL) 300 MG tablet Take 300 mg by mouth at bedtime.     traZODone (DESYREL) 50 MG tablet Take 50-100 mg by mouth at bedtime.     vitamin B-12 (CYANOCOBALAMIN) 1000 MCG tablet Take 1,000 mcg by mouth daily.      No current facility-administered medications for this visit.    Past Medical History:  Diagnosis Date   Breast screening, unspecified    Murmur    Personal  history of tobacco use, presenting hazards to health    Special screening for malignant neoplasms, colon     Past Surgical History:  Procedure Laterality Date   BREAST BIOPSY Left 10-28-01   COLONOSCOPY  2011   Dr. Evette Cristal   TONSILLECTOMY     TUBAL LIGATION       Social History   Tobacco Use   Smoking status: Every Day    Current packs/day: 1.00    Average packs/day: 1 pack/day for 20.0 years (20.0 ttl pk-yrs)    Types: Cigarettes   Smokeless tobacco: Never  Substance Use Topics   Alcohol use: Yes    Comment: occasionally   Drug use: No       Family History  Adopted: Yes  Problem Relation Age of Onset   Hypertension Mother    Hyperlipidemia Mother   No bleeding or clotting disorders  Allergies  Allergen Reactions   Ceclor [Cefaclor] Swelling     REVIEW OF SYSTEMS (Negative unless checked)  Constitutional: [] Weight loss  [] Fever  [] Chills Cardiac: [] Chest pain   [] Chest pressure   [] Palpitations   [] Shortness of breath when laying flat   [] Shortness of breath at rest   []   Shortness of breath with exertion. Vascular:  [] Pain in legs with walking   [] Pain in legs at rest   [] Pain in legs when laying flat   [] Claudication   [] Pain in feet when walking  [] Pain in feet at rest  [] Pain in feet when laying flat   [] History of DVT   [] Phlebitis   [] Swelling in legs   [] Varicose veins   [] Non-healing ulcers Pulmonary:   [] Uses home oxygen   [] Productive cough   [] Hemoptysis   [] Wheeze  [] COPD   [] Asthma Neurologic:  [] Dizziness  [] Blackouts   [] Seizures   [] History of stroke   [] History of TIA  [] Aphasia   [] Temporary blindness   [] Dysphagia   [] Weakness or numbness in arms   [] Weakness or numbness in legs Musculoskeletal:  [] Arthritis   [] Joint swelling   [] Joint pain   [] Low back pain Hematologic:  [] Easy bruising  [] Easy bleeding   [] Hypercoagulable state   [] Anemic   Gastrointestinal:  [] Blood in stool   [] Vomiting blood  [] Gastroesophageal reflux/heartburn   [] Abdominal  pain X positive for abdominal bloating Genitourinary:  [] Chronic kidney disease   [] Difficult urination  [] Frequent urination  [] Burning with urination   [] Hematuria Skin:  [] Rashes   [] Ulcers   [] Wounds Psychological:  [] History of anxiety   []  History of major depression.  Physical Examination  BP 135/79   Pulse 64   Resp 18   Ht 5\' 3"  (1.6 m)   Wt 135 lb 12.8 oz (61.6 kg)   BMI 24.06 kg/m  Gen:  WD/WN, NAD Head: [place]/AT, No temporalis wasting. Ear/Nose/Throat: Hearing grossly intact, nares w/o erythema or drainage Eyes: Conjunctiva clear. Sclera non-icteric Neck: Supple.  Trachea midline Pulmonary:  Good air movement, no use of accessory muscles.  Cardiac: RRR, no JVD Vascular:  Vessel Right Left  Radial Palpable Palpable                          PT Palpable Palpable  DP Palpable Palpable   Gastrointestinal: soft, non-tender/non-distended. No guarding/reflex.  Musculoskeletal: M/S 5/5 throughout.  No deformity or atrophy. No edema. Neurologic: Sensation grossly intact in extremities.  Symmetrical.  Speech is fluent.  Psychiatric: Judgment intact, Mood & affect appropriate for pt's clinical situation. Dermatologic: No rashes or ulcers noted.  No cellulitis or open wounds.      Labs No results found for this or any previous visit (from the past 2160 hours).  Radiology CT VENOGRAM ABD/PEL Result Date: 10/23/2023 CLINICAL DATA:  Evaluation of pelvic congestion syndrome EXAM: CT VENOGRAM ABDOMEN AND PELVIS WITH CONTRAST TECHNIQUE: Multidetector CT imaging of the abdomen and pelvis was performed using the standard protocol during bolus administration of intravenous contrast. Multiplanar reconstructed images and MIPs were obtained and reviewed to evaluate the vascular anatomy. RADIATION DOSE REDUCTION: This exam was performed according to the departmental dose-optimization program which includes automated exposure control, adjustment of the mA and/or kV according to patient  size and/or use of iterative reconstruction technique. CONTRAST:  OMNIPAQUE IOHEXOL 350 MG/ML SOLN COMPARISON:  CT AP, 12/19/2022. FINDINGS: VASCULAR Arterial: Severe burden of calcified aortoiliac atherosclerosis without aneurysmal dilatation. Venous: Prominent LEFT periuterine vein with engorge/distended LEFT gonadal vein. Review of the MIP images confirms the above findings. NON-VASCULAR Lower chest: Cardiomegaly.  Calcified coronary atherosclerosis. Hepatobiliary: No focal liver abnormality is seen. No gallstones, gallbladder wall thickening, or biliary dilatation. Pancreas: No pancreatic ductal dilatation or surrounding inflammatory changes. Spleen: Normal in size without focal abnormality. Adrenals/Urinary  Tract: Adrenal glands are unremarkable. Small RIGHT superior and LEFT inferior renal pole renal cortical hypodensities, too small to characterize though likely small cysts. No follow-up is indicated. Mild pelviectasis. Kidneys are otherwise normal, without renal calculi or hydronephrosis. Bladder is moderately distended. Stomach/Bowel: Stomach is within normal limits. Appendix appears normal. No evidence of bowel wall thickening, distention, or inflammatory changes. Lymphatic: No enlarged abdominal or pelvic lymph nodes. Reproductive: Uterus and adnexa are unremarkable. RIGHT superficial pubic cystic lesion at the labia majora measuring approximately 4.5 x 2.5 cm, likely a large Bartholin gland cyst. Other: No abdominal wall hernia or abnormality. No abdominopelvic ascites. Musculoskeletal: Scattered degenerative changes of imaged spine. No acute osseous findings. IMPRESSION: VASCULAR 1. Prominent LEFT periuterine veins and a distended LEFT gonadal vein. In the presence of corresponding symptoms, findings are likely to represent pelvic venous congestion. 2.  Aortic Atherosclerosis (ICD10-I70.0). NON-VASCULAR 1. Cardiomegaly and incidental 5 cm large LEFT labial, likely a Bartholin gland cyst. 2.   Additional incidental, chronic and senescent findings as above. Roanna Banning, MD [facility] Radiology Electronically Signed   By: Roanna Banning M.D.   On: 10/23/2023 17:32    Assessment/Plan  Pelvic congestive syndrome She has undergone a CT venogram which I have independently reviewed.  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  We had a long discussion today about pelvic congestion syndrome.  We discussed the pathophysiology and natural history.  We discussed the primary treatment option being coil embolization of the left gonadal vein and pelvic varicosities.  I discussed the risks and benefits of the procedure.  Patient is interested in proceeding this will be scheduled in the near future at her convenience.  Essential hypertension blood pressure control important in reducing the progression of atherosclerotic disease. On appropriate oral medications.   Hyperlipidemia lipid control important in reducing the progression of atherosclerotic disease. Continue statin therapy    Festus Barren, MD  11/09/2023 4:30 PM    This note was created with Dragon medical transcription system.  Any errors from dictation are purely unintentional

## 2023-11-09 NOTE — Assessment & Plan Note (Signed)
 She has undergone a CT venogram which I have independently reviewed.  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  We had a long discussion today about pelvic congestion syndrome.  We discussed the pathophysiology and natural history.  We discussed the primary treatment option being coil embolization of the left gonadal vein and pelvic varicosities.  I discussed the risks and benefits of the procedure.  Patient is interested in proceeding this will be scheduled in the near future at her convenience.

## 2023-11-09 NOTE — Assessment & Plan Note (Signed)
 blood pressure control important in reducing the progression of atherosclerotic disease. On appropriate oral medications.

## 2023-11-09 NOTE — Assessment & Plan Note (Signed)
 lipid control important in reducing the progression of atherosclerotic disease. Continue statin therapy

## 2023-11-12 ENCOUNTER — Telehealth (INDEPENDENT_AMBULATORY_CARE_PROVIDER_SITE_OTHER): Payer: Self-pay

## 2023-11-12 NOTE — Telephone Encounter (Signed)
 Spoke with the patient and she is scheduled with Dr. Wyn Quaker on 11/29/23 with a 11:00 am arrival time to the [facility] for a pelvic embolization. Pre-procedure instructions were discussed and will be sent to Mychart and mailed. Patient was offered 11/19/23 and 11/26/23 and declined.

## 2023-11-12 NOTE — Telephone Encounter (Signed)
 I attempted to contact the patient to schedule pevic embolization with Dr. Wyn Quaker. A  message was left for a return call.

## 2023-11-28 ENCOUNTER — Telehealth (INDEPENDENT_AMBULATORY_CARE_PROVIDER_SITE_OTHER): Payer: Self-pay

## 2023-11-28 NOTE — Telephone Encounter (Signed)
 Spoke with the patient and due to waiting on her insurance to give a prior auth for a procedure that was scheduled for 11/29/23 with Dr. Wyn Quaker. Patient has been rescheduled to 12/13/23 to have her pelvic embolization at the [facility]. I am now waiting on a prior auth.

## 2023-11-29 DIAGNOSIS — N9489 Other specified conditions associated with female genital organs and menstrual cycle: Secondary | ICD-10-CM

## 2023-12-13 ENCOUNTER — Encounter: Admission: RE | Payer: Self-pay | Source: Home / Self Care

## 2023-12-13 ENCOUNTER — Ambulatory Visit: Admission: RE | Admit: 2023-12-13 | Source: Home / Self Care | Admitting: Vascular Surgery

## 2023-12-13 DIAGNOSIS — N9489 Other specified conditions associated with female genital organs and menstrual cycle: Secondary | ICD-10-CM

## 2023-12-13 SURGERY — EMBOLIZATION
Anesthesia: Moderate Sedation

## 2023-12-17 ENCOUNTER — Other Ambulatory Visit (INDEPENDENT_AMBULATORY_CARE_PROVIDER_SITE_OTHER): Payer: Self-pay | Admitting: Nurse Practitioner

## 2023-12-17 DIAGNOSIS — M25572 Pain in left ankle and joints of left foot: Secondary | ICD-10-CM

## 2023-12-19 ENCOUNTER — Ambulatory Visit
Admission: RE | Admit: 2023-12-19 | Discharge: 2023-12-19 | Disposition: A | Discharge status: Home / Self Care | Attending: Nurse Practitioner | Admitting: Nurse Practitioner

## 2023-12-19 ENCOUNTER — Ambulatory Visit
Admission: RE | Admit: 2023-12-19 | Discharge: 2023-12-19 | Disposition: A | Discharge status: Home / Self Care | Source: Ambulatory Visit | Attending: Physician Assistant | Admitting: Physician Assistant

## 2023-12-19 ENCOUNTER — Ambulatory Visit
Admission: RE | Admit: 2023-12-19 | Discharge: 2023-12-19 | Disposition: A | Discharge status: Home / Self Care | Source: Ambulatory Visit | Attending: Nurse Practitioner | Admitting: Nurse Practitioner

## 2023-12-19 ENCOUNTER — Other Ambulatory Visit: Payer: Self-pay | Admitting: Physician Assistant

## 2023-12-19 DIAGNOSIS — R609 Edema, unspecified: Secondary | ICD-10-CM

## 2023-12-19 DIAGNOSIS — M25572 Pain in left ankle and joints of left foot: Secondary | ICD-10-CM

## 2023-12-24 ENCOUNTER — Encounter (INDEPENDENT_AMBULATORY_CARE_PROVIDER_SITE_OTHER): Payer: Self-pay | Admitting: Nurse Practitioner

## 2024-01-03 ENCOUNTER — Telehealth (INDEPENDENT_AMBULATORY_CARE_PROVIDER_SITE_OTHER): Payer: Self-pay

## 2024-01-03 NOTE — Telephone Encounter (Signed)
 Patient called wanting an update on her appeal to have surgery with Dr. Vonna Guardian. Patient was informed that the appeal has been sent and I am waiting on a reply.

## 2024-01-14 ENCOUNTER — Telehealth (INDEPENDENT_AMBULATORY_CARE_PROVIDER_SITE_OTHER): Payer: Self-pay

## 2024-01-14 NOTE — Telephone Encounter (Signed)
 Patient stated that her left leg started with swelling in ankle, her PCP treated it as cellulitis 2 times with Abx, and went to the ER for US  to check for blood clots (12/19/23). Swelling went down when Tanya called her last week (no note of details) so she stated she felt the didn't need an appt.  Swelling started again 2 days ago radiating to her foot with redness and pain.   Please advise

## 2024-01-14 NOTE — Telephone Encounter (Signed)
 Let's see if also possible to add a reflux study to her upcoming visit

## 2024-01-15 ENCOUNTER — Encounter (INDEPENDENT_AMBULATORY_CARE_PROVIDER_SITE_OTHER): Payer: Self-pay

## 2024-01-15 ENCOUNTER — Other Ambulatory Visit (INDEPENDENT_AMBULATORY_CARE_PROVIDER_SITE_OTHER): Payer: Self-pay | Admitting: Nurse Practitioner

## 2024-01-15 DIAGNOSIS — M7989 Other specified soft tissue disorders: Secondary | ICD-10-CM

## 2024-01-15 DIAGNOSIS — M79605 Pain in left leg: Secondary | ICD-10-CM

## 2024-01-15 DIAGNOSIS — I739 Peripheral vascular disease, unspecified: Secondary | ICD-10-CM

## 2024-01-15 NOTE — Telephone Encounter (Signed)
 Yes

## 2024-01-17 ENCOUNTER — Ambulatory Visit (INDEPENDENT_AMBULATORY_CARE_PROVIDER_SITE_OTHER)

## 2024-01-17 ENCOUNTER — Ambulatory Visit (INDEPENDENT_AMBULATORY_CARE_PROVIDER_SITE_OTHER): Admitting: Nurse Practitioner

## 2024-01-17 VITALS — BP 124/72 | HR 73 | Resp 17 | Ht 63.0 in | Wt 136.4 lb

## 2024-01-17 DIAGNOSIS — I10 Essential (primary) hypertension: Secondary | ICD-10-CM | POA: Diagnosis not present

## 2024-01-17 DIAGNOSIS — N9489 Other specified conditions associated with female genital organs and menstrual cycle: Secondary | ICD-10-CM

## 2024-01-17 DIAGNOSIS — M79605 Pain in left leg: Secondary | ICD-10-CM

## 2024-01-17 DIAGNOSIS — I739 Peripheral vascular disease, unspecified: Secondary | ICD-10-CM | POA: Diagnosis not present

## 2024-01-17 DIAGNOSIS — M7989 Other specified soft tissue disorders: Secondary | ICD-10-CM

## 2024-01-17 DIAGNOSIS — M25572 Pain in left ankle and joints of left foot: Secondary | ICD-10-CM

## 2024-01-17 MED ORDER — FUROSEMIDE 20 MG PO TABS: 20.0000 mg | ORAL_TABLET | Freq: Every day | ORAL | 0 refills | Status: AC | PRN

## 2024-01-21 ENCOUNTER — Encounter (INDEPENDENT_AMBULATORY_CARE_PROVIDER_SITE_OTHER): Payer: Self-pay | Admitting: Nurse Practitioner

## 2024-01-21 NOTE — Progress Notes (Signed)
 Subjective:    Patient ID: Peggy Nielsen, female    DOB: 28-Feb-1956, 68 y.o.   MRN: [id number] Chief Complaint  Patient presents with   Venous Insufficiency    The patient presents for evaluation of pain and swelling of her left lower extremity.  She notes that initially it began with swelling and pain in the left lower extremity and ankle area.  She notes that she was initially treated for cellulitis and the redness and pain improved.  She was given a round of steroids and this also helped but eventually the swelling and discomfort returned.  She notes today that the pain is not as severe as it once was but she still continues to have swelling and tenderness in the left foot and ankle area.  She is utilize medical grade compression and has not completely resolve these issues.  She tries to elevate as much as she can and it also improves the situation but does not completely resolve it.    Review of Systems  Cardiovascular:  Positive for leg swelling.  Musculoskeletal:  Positive for arthralgias.  All other systems reviewed and are negative.      Objective:    Physical Exam Vitals reviewed.  HENT:     Head: Normocephalic.  Cardiovascular:     Rate and Rhythm: Normal rate.     Pulses: Normal pulses.  Pulmonary:     Effort: Pulmonary effort is normal.  Musculoskeletal:     Left lower leg: Edema present.  Skin:    General: Skin is warm and dry.  Neurological:     Mental Status: She is alert and oriented to person, place, and time.  Psychiatric:        Mood and Affect: Mood normal.        Behavior: Behavior normal.        Thought Content: Thought content normal.        Judgment: Judgment normal.     BP 124/72 (BP Location: Right Arm, Patient Position: Sitting, Cuff Size: Normal)   Pulse 73   Resp 17   Ht 5\' 3"  (1.6 m)   Wt 136 lb 6.4 oz (61.9 kg)   BMI 24.16 kg/m   Past Medical History:  Diagnosis Date   Breast screening, unspecified    Murmur    Personal history of  tobacco use, presenting hazards to health    Special screening for malignant neoplasms, colon     Social History   Socioeconomic History   Marital status: Married    Spouse name: Not on file   Number of children: Not on file   Years of education: Not on file   Highest education level: Not on file  Occupational History   Not on file  Tobacco Use   Smoking status: Every Day    Current packs/day: 1.00    Average packs/day: 1 pack/day for 20.0 years (20.0 ttl pk-yrs)    Types: Cigarettes   Smokeless tobacco: Never  Substance and Sexual Activity   Alcohol use: Yes    Comment: occasionally   Drug use: No   Sexual activity: Not on file  Other Topics Concern   Not on file  Social History Narrative   Not on file   Social Drivers of Health   Financial Resource Strain: Not on file  Food Insecurity: Not on file  Transportation Needs: Not on file  Physical Activity: Not on file  Stress: Not on file  Social Connections: Not on file  Intimate Partner  Violence: Not on file    Past Surgical History:  Procedure Laterality Date   BREAST BIOPSY Left 10-28-01   COLONOSCOPY  2011   Dr. Lorel Roes   TONSILLECTOMY     TUBAL LIGATION      Family History  Adopted: Yes  Problem Relation Age of Onset   Hypertension Mother    Hyperlipidemia Mother     Allergies  Allergen Reactions   Ceclor [Cefaclor] Swelling       Latest Ref Rng & Units 11/30/2021   11:48 PM  CBC  WBC 4.0 - 10.5 K/uL 12.1   Hemoglobin 12.0 - 15.0 g/dL 47.4   Hematocrit 25.9 - 46.0 % 37.1   Platelets 150 - 400 K/uL 291        CMP     Component Value Date/Time   NA 134 (L) 11/30/2021 2348   K 3.2 (L) 11/30/2021 2348   CL 101 11/30/2021 2348   CO2 20 (L) 11/30/2021 2348   GLUCOSE 122 (H) 11/30/2021 2348   BUN 9 11/30/2021 2348   CREATININE 0.53 11/30/2021 2348   CALCIUM 9.5 11/30/2021 2348   PROT 7.7 11/30/2021 2348   ALBUMIN 4.6 11/30/2021 2348   AST 35 11/30/2021 2348   ALT 27 11/30/2021 2348    ALKPHOS 56 11/30/2021 2348   BILITOT 0.5 11/30/2021 2348   GFRNONAA >60 11/30/2021 2348     No results found.     Assessment & Plan:   1. Pain of joint of left ankle and foot (Primary) I do not feel that her pelvic congestion syndrome is because of this swelling in her left lower extremity.  Based on her noninvasive studies as well as her description I believe that she does have a component of lymphedema as there does not seem to be an underlying musculoskeletal issue.  I have discussed conservative therapy with her.  We also discussed the use of Lasix and that it may be helpful but a many times it is not but we have sent a prescription to see if it is helpful for her.  We discussed a lymphedema pump.  Patient will continue with conservative therapy until we have her back regarding her appeal as noted below.  2. Pelvic congestive syndrome This continues to give the patient's significant symptoms.  We have placed an appeal to her insurance company regarding treatment and we are still awaiting the decision.  3. Essential hypertension Continue antihypertensive medications as already ordered, these medications have been reviewed and there are no changes at this time.   Current Outpatient Medications on File Prior to Visit  Medication Sig Dispense Refill   ALPRAZolam (XANAX) 0.5 MG tablet Take 0.5 mg by mouth 2 (two) times daily.      amLODipine (NORVASC) 10 MG tablet Take 10 mg by mouth daily.  1   aspirin EC 81 MG tablet Take 81 mg by mouth daily.      Cholecalciferol (VITAMIN D3) 1000 units CAPS Take by mouth daily.      CLINPRO 5000 1.1 % PSTE Place onto teeth.     CRESTOR 20 MG tablet Take 1 tablet by mouth daily.     gabapentin (NEURONTIN) 100 MG capsule Take 100 mg by mouth at bedtime.     lisinopril (PRINIVIL,ZESTRIL) 40 MG tablet Take 40 mg by mouth daily.  1   meloxicam (MOBIC) 15 MG tablet Take 15 mg by mouth as needed.     omeprazole (PRILOSEC) 40 MG capsule Take 40 mg by mouth  as needed.     primidone (MYSOLINE) 50 MG tablet Take 50 mg by mouth 2 (two) times daily.      progesterone (PROMETRIUM) 100 MG capsule SMARTSIG:1 Capsule(s) By Mouth Every Evening     traZODone (DESYREL) 50 MG tablet Take 50-100 mg by mouth at bedtime.     vitamin B-12 (CYANOCOBALAMIN) 1000 MCG tablet Take 1,000 mcg by mouth daily.      doxycycline (PERIOSTAT) 20 MG tablet Take 20 mg by mouth 2 (two) times daily. (Patient not taking: Reported on 01/17/2024)     LINZESS 72 MCG capsule Take 72 mcg by mouth daily. (Patient not taking: Reported on 01/17/2024)     trazodone (DESYREL) 300 MG tablet Take 300 mg by mouth at bedtime. (Patient not taking: Reported on 01/17/2024)     No current facility-administered medications on file prior to visit.    There are no Patient Instructions on file for this visit. No follow-ups on file.   Beatrix Breece E Deaundra Dupriest, NP

## 2024-02-08 ENCOUNTER — Telehealth (INDEPENDENT_AMBULATORY_CARE_PROVIDER_SITE_OTHER): Payer: Self-pay

## 2024-02-08 NOTE — Telephone Encounter (Signed)
 Patient called and left a message for a return call. I returned the patient's call and a message was left for a return call.

## 2024-02-09 ENCOUNTER — Other Ambulatory Visit (INDEPENDENT_AMBULATORY_CARE_PROVIDER_SITE_OTHER): Payer: Self-pay | Admitting: Nurse Practitioner

## 2024-02-11 NOTE — Telephone Encounter (Signed)
 Spoke with the patient today and gave her the recommendation from Sharla Lemar NP regarding the patient's insurance deny the appeal. Per Sharla Sciandra NP the patient can sign a waiver and have the procedure or go back to there PCP and discuss hormonal treatments and see how those work. Patient was informed of this and stated she would speak with her PCP and move forward from that. Patient will also mail in the denial letter from her insurance as well.

## 2024-02-12 ENCOUNTER — Telehealth (INDEPENDENT_AMBULATORY_CARE_PROVIDER_SITE_OTHER): Payer: Self-pay

## 2024-02-12 NOTE — Telephone Encounter (Signed)
 Patient called and left a message stating that she called her PCP and let them know what the alternatives were based off her insurance denying her procedure, the appeal and what to do from here. Patient stated her PCP told her that Vascular is supposed to give her alternative medications and options since she was referred to us . Please advise.

## 2024-02-18 ENCOUNTER — Other Ambulatory Visit (INDEPENDENT_AMBULATORY_CARE_PROVIDER_SITE_OTHER): Payer: Self-pay | Admitting: Nurse Practitioner

## 2024-02-18 DIAGNOSIS — N9489 Other specified conditions associated with female genital organs and menstrual cycle: Secondary | ICD-10-CM

## 2024-02-18 NOTE — Telephone Encounter (Signed)
 I had a discussion with the patient last week regarding possible hormonal treatments for pelvic congestion syndrome.  Unfortunately, the are not failure with the possible hormonal options.  Vascular surgery but we will try to refer to OB/GYN for possible treatment option since her insurance has denied the appeal for surgical intervention options.

## 2024-04-02 ENCOUNTER — Ambulatory Visit: Admitting: Obstetrics and Gynecology

## 2024-04-02 ENCOUNTER — Encounter: Payer: Self-pay | Admitting: Obstetrics and Gynecology

## 2024-04-02 VITALS — Ht 63.0 in

## 2024-04-02 DIAGNOSIS — N9489 Other specified conditions associated with female genital organs and menstrual cycle: Secondary | ICD-10-CM

## 2024-04-02 DIAGNOSIS — Z7689 Persons encountering health services in other specified circumstances: Secondary | ICD-10-CM

## 2024-04-02 NOTE — Progress Notes (Signed)
 Patient presents today due to pelvic congestion syndrome diagnosed by [facility]. She states her most bothersome symptom is bloating and slight discomfort, denies pain.

## 2024-04-02 NOTE — Progress Notes (Signed)
 HPI:      [name] is a 68 y.o. [phone] who LMP was No LMP recorded. Patient is postmenopausal.  Subjective:   She presents today because she been diagnosed with pelvic congestion syndrome.  She has seen vascular for possible embolization but her insurance company denied embolization.  Her main symptom is abdominal bloating.  She reports that when this happens some of her close do not even fit.  She says this has been going on for the last 2 years. She does report issues with diarrhea and she has been medically treated for this over the last year.    Hx: The following portions of the patient's history were reviewed and updated as appropriate:             She  has a past medical history of Breast screening, unspecified, Murmur, Personal history of tobacco use, presenting hazards to health, and Special screening for malignant neoplasms, colon. She does not have any pertinent problems on file. She  has a past surgical history that includes Tonsillectomy; Tubal ligation; Colonoscopy (2011); and Breast biopsy (Left, 10-28-01). Her family history includes Hyperlipidemia in her mother; Hypertension in her mother. She was adopted. She  reports that she has been smoking cigarettes. She has a 20 pack-year smoking history. She has never used smokeless tobacco. She reports current alcohol use. She reports that she does not use drugs. She has a current medication list which includes the following prescription(s): alprazolam, amlodipine, vitamin d3, clinpro 5000, crestor, furosemide , lisinopril, meloxicam, omeprazole, primidone, trazodone, cyanocobalamin, aspirin ec, doxycycline, gabapentin, linzess, progesterone, and trazodone. She is allergic to ceclor [cefaclor].       Review of Systems:  Review of Systems  Constitutional: Denied constitutional symptoms, night sweats, recent illness, fatigue, fever, insomnia and weight loss.  Eyes: Denied eye symptoms, eye pain, photophobia, vision change and visual  disturbance.  Ears/Nose/Throat/Neck: Denied ear, nose, throat or neck symptoms, hearing loss, nasal discharge, sinus congestion and sore throat.  Cardiovascular: Denied cardiovascular symptoms, arrhythmia, chest pain/pressure, edema, exercise intolerance, orthopnea and palpitations.  Respiratory: Denied pulmonary symptoms, asthma, pleuritic pain, productive sputum, cough, dyspnea and wheezing.  Gastrointestinal: Denied, gastro-esophageal reflux, melena, nausea and vomiting.  Genitourinary: See HPI for additional information.  Musculoskeletal: Denied musculoskeletal symptoms, stiffness, swelling, muscle weakness and myalgia.  Dermatologic: Denied dermatology symptoms, rash and scar.  Neurologic: Denied neurology symptoms, dizziness, headache, neck pain and syncope.  Psychiatric: Denied psychiatric symptoms, anxiety and depression.  Endocrine: Denied endocrine symptoms including hot flashes and night sweats.   Meds:   Current Outpatient Medications on File Prior to Visit  Medication Sig Dispense Refill   ALPRAZolam (XANAX) 0.5 MG tablet Take 0.5 mg by mouth 2 (two) times daily.      amLODipine (NORVASC) 10 MG tablet Take 10 mg by mouth daily.  1   Cholecalciferol (VITAMIN D3) 1000 units CAPS Take by mouth daily.      CLINPRO 5000 1.1 % PSTE Place onto teeth.     CRESTOR 20 MG tablet Take 1 tablet by mouth daily.     furosemide  (LASIX ) 20 MG tablet Take 1 tablet (20 mg total) by mouth daily as needed for edema. 30 tablet 0   lisinopril (PRINIVIL,ZESTRIL) 40 MG tablet Take 40 mg by mouth daily.  1   meloxicam (MOBIC) 15 MG tablet Take 15 mg by mouth as needed.     omeprazole (PRILOSEC) 40 MG capsule Take 40 mg by mouth as needed.     primidone (MYSOLINE) 50  MG tablet Take 50 mg by mouth 2 (two) times daily.      traZODone (DESYREL) 50 MG tablet Take 50-100 mg by mouth at bedtime.     vitamin B-12 (CYANOCOBALAMIN) 1000 MCG tablet Take 1,000 mcg by mouth daily.      aspirin EC 81 MG tablet Take  81 mg by mouth daily.  (Patient not taking: Reported on 04/02/2024)     doxycycline (PERIOSTAT) 20 MG tablet Take 20 mg by mouth 2 (two) times daily. (Patient not taking: Reported on 01/17/2024)     gabapentin (NEURONTIN) 100 MG capsule Take 100 mg by mouth at bedtime.     LINZESS 72 MCG capsule Take 72 mcg by mouth daily. (Patient not taking: Reported on 01/17/2024)     progesterone (PROMETRIUM) 100 MG capsule SMARTSIG:1 Capsule(s) By Mouth Every Evening (Patient not taking: Reported on 04/02/2024)     trazodone (DESYREL) 300 MG tablet Take 300 mg by mouth at bedtime. (Patient not taking: Reported on 04/02/2024)     No current facility-administered medications on file prior to visit.      Objective:     There were no vitals filed for this visit. There were no vitals filed for this visit.                      Assessment:    G3P2013 Patient Active Problem List   Diagnosis Date Noted   Pelvic congestive syndrome 11/09/2023   Essential hypertension 11/09/2023   Hyperlipidemia 11/09/2023   Fibrocystic disease of breast 12/30/2013     1. Establishing care with new doctor, encounter for   2. Pelvic congestion syndrome     She has atypical symptoms for pelvic congestion syndrome.  Her main symptom seems to be bloating.  The treatment that I can offer her medically would be to take Megace and use NSAID S for pelvic pain.  Megace is known to cause bloating and weight gain which are the exact symptoms were trying to avoid.  I do not think this is going to solve her bloating issues.  I see no evidence of ascites or other possible causes for her abdominal bloating.   Plan:            1.  She has declined high-dose progesterone for her pelvic congestion syndrome and bloating as high-dose progesterone causes bloating.  2.  She says that she feels uncomfortable enough that she would like to undergo the embolization procedure for her pelvic congestion syndrome.  I can offer her nothing medically  to fix this as her main symptom is bloating. Orders No orders of the defined types were placed in this encounter.   No orders of the defined types were placed in this encounter.     F/U  No follow-ups on file.  Alm DOROTHA Sar, M.D. 04/02/2024 8:42 AM

## 2024-04-14 ENCOUNTER — Telehealth (INDEPENDENT_AMBULATORY_CARE_PROVIDER_SITE_OTHER): Payer: Self-pay

## 2024-04-14 NOTE — Telephone Encounter (Signed)
 Patient left a message for a return call. Patient was called. Patient stated she saw a GYN and was told there was nothing that they could do and asked what was the next step. I informed the patient that I was not given any information to get her scheduled for a procedure and she would have to speak with Orvin Daring NP regarding what or if anything else is to be done. Patient wanted to make an appt to come in and speak with the NP and was transferred to the front desk to do so.

## 2024-05-13 ENCOUNTER — Ambulatory Visit (INDEPENDENT_AMBULATORY_CARE_PROVIDER_SITE_OTHER): Admitting: Nurse Practitioner

## 2024-05-13 ENCOUNTER — Encounter (INDEPENDENT_AMBULATORY_CARE_PROVIDER_SITE_OTHER): Payer: Self-pay | Admitting: Nurse Practitioner

## 2024-05-13 VITALS — BP 113/69 | HR 66 | Ht 63.0 in | Wt 141.0 lb

## 2024-05-13 DIAGNOSIS — N9489 Other specified conditions associated with female genital organs and menstrual cycle: Secondary | ICD-10-CM | POA: Diagnosis not present

## 2024-05-13 DIAGNOSIS — I10 Essential (primary) hypertension: Secondary | ICD-10-CM | POA: Diagnosis not present

## 2024-05-18 ENCOUNTER — Encounter (INDEPENDENT_AMBULATORY_CARE_PROVIDER_SITE_OTHER): Payer: Self-pay | Admitting: Nurse Practitioner

## 2024-05-18 NOTE — Progress Notes (Signed)
 MRN : [id number]  Peggy Nielsen is a 68 y.o. (1955-12-10) female who presents with chief complaint of  Chief Complaint  Patient presents with   Follow-up     fu no studies see FB   .  History of Present Illness: Patient returns today in follow up of her abdominal bloating.  Her symptoms are similar to slightly worse than what they were when we saw her in the office back in January.  She has began to experience swelling of her lower extremities in addition to discomfort related to her bloating as well.  She notes that it can become painful at times.  She still complains predominantly of abdominal bloating and fullness.  She recently went to an OB/GYN for discussion about starting monotherapy.  She previously did hormonal therapy but did not tolerate this well.  Following discussion of risk versus benefit her OB/GYN recommended against beginning progesterone therapy for treatment of pelvic congestion syndrome as it would likely increase her pain and bloating.  Did not feel it would be an effective option for her.  This affects her lower abdomen the most.  She has undergone a CT venogram which I have independently reviewed.  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  Current Outpatient Medications  Medication Sig Dispense Refill   ALPRAZolam (XANAX) 0.5 MG tablet Take 0.5 mg by mouth 2 (two) times daily.      amLODipine (NORVASC) 10 MG tablet Take 10 mg by mouth daily.  1   Cholecalciferol (VITAMIN D3) 1000 units CAPS Take by mouth daily.      CRESTOR 20 MG tablet Take 1 tablet by mouth daily.     furosemide  (LASIX ) 20 MG tablet Take 1 tablet (20 mg total) by mouth daily as needed for edema. 30 tablet 0   LINZESS 72 MCG capsule Take 72 mcg by mouth daily.     lisinopril (PRINIVIL,ZESTRIL) 40 MG tablet Take 40 mg by mouth daily.  1   omeprazole (PRILOSEC) 40 MG capsule Take 40 mg by mouth as needed.      primidone (MYSOLINE) 50 MG tablet Take 50 mg by mouth 2 (two) times daily.      progesterone (PROMETRIUM) 100 MG capsule SMARTSIG:1 Capsule(s) By Mouth Every Evening     vitamin B-12 (CYANOCOBALAMIN) 1000 MCG tablet Take 1,000 mcg by mouth daily.      aspirin EC 81 MG tablet Take 81 mg by mouth daily.  (Patient not taking: Reported on 05/13/2024)     CLINPRO 5000 1.1 % PSTE Place onto teeth. (Patient not taking: Reported on 05/13/2024)     doxycycline (PERIOSTAT) 20 MG tablet Take 20 mg by mouth 2 (two) times daily. (Patient not taking: Reported on 05/13/2024)     gabapentin (NEURONTIN) 100 MG capsule Take 100 mg by mouth at bedtime. (Patient not taking: Reported on 05/13/2024)     meloxicam (MOBIC) 15 MG tablet Take 15 mg by mouth as needed. (Patient not taking: Reported on 05/13/2024)     trazodone (DESYREL) 300 MG tablet Take 300 mg by mouth at bedtime. (Patient not taking: Reported on 05/13/2024)     traZODone (DESYREL) 50 MG tablet Take 50-100 mg by mouth at bedtime. (Patient not taking: Reported on 05/13/2024)     No current facility-administered medications for this visit.    Past Medical History:  Diagnosis Date   Breast screening, unspecified    Murmur    Personal  history of tobacco use, presenting hazards to health    Special screening for malignant neoplasms, colon     Past Surgical History:  Procedure Laterality Date   BREAST BIOPSY Left 10-28-01   COLONOSCOPY  2011   Dr. Dellie   TONSILLECTOMY     TUBAL LIGATION       Social History   Tobacco Use   Smoking status: Every Day    Current packs/day: 1.00    Average packs/day: 1 pack/day for 20.0 years (20.0 ttl pk-yrs)    Types: Cigarettes   Smokeless tobacco: Never  Substance Use Topics   Alcohol use: Yes    Comment: occasionally   Drug use: No       Family History  Adopted: Yes  Problem Relation Age of Onset   Hypertension Mother    Hyperlipidemia Mother   No bleeding or clotting disorders  Allergies   Allergen Reactions   Ceclor [Cefaclor] Swelling     REVIEW OF SYSTEMS (Negative unless checked)  Constitutional: [] Weight loss  [] Fever  [] Chills Cardiac: [] Chest pain   [] Chest pressure   [] Palpitations   [] Shortness of breath when laying flat   [] Shortness of breath at rest   [] Shortness of breath with exertion. Vascular:  [] Pain in legs with walking   [] Pain in legs at rest   [] Pain in legs when laying flat   [] Claudication   [] Pain in feet when walking  [] Pain in feet at rest  [] Pain in feet when laying flat   [] History of DVT   [] Phlebitis   [] Swelling in legs   [] Varicose veins   [] Non-healing ulcers Pulmonary:   [] Uses home oxygen   [] Productive cough   [] Hemoptysis   [] Wheeze  [] COPD   [] Asthma Neurologic:  [] Dizziness  [] Blackouts   [] Seizures   [] History of stroke   [] History of TIA  [] Aphasia   [] Temporary blindness   [] Dysphagia   [] Weakness or numbness in arms   [] Weakness or numbness in legs Musculoskeletal:  [] Arthritis   [] Joint swelling   [] Joint pain   [] Low back pain Hematologic:  [] Easy bruising  [] Easy bleeding   [] Hypercoagulable state   [] Anemic   Gastrointestinal:  [] Blood in stool   [] Vomiting blood  [] Gastroesophageal reflux/heartburn   [] Abdominal pain X positive for abdominal bloating Genitourinary:  [] Chronic kidney disease   [] Difficult urination  [] Frequent urination  [] Burning with urination   [] Hematuria Skin:  [] Rashes   [] Ulcers   [] Wounds Psychological:  [] History of anxiety   []  History of major depression.  Physical Examination  BP 113/69   Pulse 66   Ht 5' 3 (1.6 m)   Wt 141 lb (64 kg)   BMI 24.98 kg/m  Gen:  WD/WN, NAD Head: [place]/AT, No temporalis wasting. Ear/Nose/Throat: Hearing grossly intact, nares w/o erythema or drainage Eyes: Conjunctiva clear. Sclera non-icteric Neck: Supple.  Trachea midline Pulmonary:  Good air movement, no use of accessory muscles.  Cardiac: RRR, no JVD Vascular:  Vessel Right Left  Radial Palpable Palpable                           PT Palpable Palpable  DP Palpable Palpable   Gastrointestinal: soft, non-tender/non-distended. No guarding/reflex.  Musculoskeletal: M/S 5/5 throughout.  No deformity or atrophy. No edema. Neurologic: Sensation grossly intact in extremities.  Symmetrical.  Speech is fluent.  Psychiatric: Judgment intact, Mood & affect appropriate for pt's clinical situation. Dermatologic: No rashes or ulcers noted.  No cellulitis  or open wounds.      Labs No results found for this or any previous visit (from the past 2160 hours).  Radiology No results found.   Assessment/Plan  1. Pelvic congestive syndrome (Primary) She has undergone a CT venogram which I have independently reviewed.  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  We had a long discussion today about pelvic congestion syndrome.  We discussed the pathophysiology and natural history.  We discussed the primary treatment option being coil embolization of the left gonadal vein and pelvic varicosities.  The patient is also referred with a OB/GYN for hormonal therapy to treat her pelvic congestion syndrome.  It was recommended that the patient not undergo progesterone therapy for reduction at this this would likely cause worsening pain symptoms.  OB/GYN specialty specifically recommends against the use of progesterone for this treatment.  If her symptoms continue and they continue to be lifestyle limiting and altering and debilitating for the patient.  I discussed the risks and benefits of the procedure.  Patient is interested in proceeding this will be scheduled in the near future at her convenience.   2. Essential hypertension Continue antihypertensive medications as already ordered, these medications have been reviewed and there are no changes at this time.     Weslynn Ke E Sarp Vernier, NP  05/18/2024 10:54 PM    This note was created  with Dragon medical transcription system.  Any errors from dictation are purely unintentional

## 2024-08-05 ENCOUNTER — Telehealth (INDEPENDENT_AMBULATORY_CARE_PROVIDER_SITE_OTHER): Payer: Self-pay

## 2024-08-05 NOTE — Telephone Encounter (Signed)
 Patient called at this time about getting surgery scheduled and if her insurance had approved surgery? Looking at patients chart this was something discussed with patient, but where is it in the process now? Please advise?

## 2024-08-11 NOTE — Telephone Encounter (Signed)
 Patient called at this time to check the status of her congestive pelvic syndrome surgery and if the insurance had approved the request for surgery at this time? Please Advise.

## 2024-08-27 ENCOUNTER — Telehealth (INDEPENDENT_AMBULATORY_CARE_PROVIDER_SITE_OTHER): Payer: Self-pay

## 2024-08-27 NOTE — Telephone Encounter (Signed)
 I attempted to contact the patient to schedule her for a pelvic venous congestion with Dr. Marea. A message was left for a return call.

## 2024-09-01 ENCOUNTER — Telehealth (INDEPENDENT_AMBULATORY_CARE_PROVIDER_SITE_OTHER): Payer: Self-pay

## 2024-09-01 NOTE — Telephone Encounter (Signed)
 I attempted to contact the patient to schedule a pelvic venous congestion procedure with Dr. Marea. A message was left for a return call.

## 2024-09-01 NOTE — Telephone Encounter (Signed)
 Spoke with the patient and she has been scheduled with Dr. Marea for a pelvic venous congestion procedure at the [facility] with a 8:00 am arrival time on 09/04/24. Pre-procedure instructions were discussed and will be sent to Mychart and mailed.

## 2024-09-03 ENCOUNTER — Telehealth (INDEPENDENT_AMBULATORY_CARE_PROVIDER_SITE_OTHER): Payer: Self-pay

## 2024-09-03 NOTE — Telephone Encounter (Signed)
 I called the patient to schedule reschedule her pelvic venous congestion procedure with Dr. Marea. Patient was offered 09/11/24 and 09/15/24 and declined  both dates along with that whole week. Patient then states she will be off 10/20/24 - 11/09/24 and asked if she could be scheduled during that time. I did let the patient know that I would have to get the auth updated.

## 2024-09-04 DIAGNOSIS — N9489 Other specified conditions associated with female genital organs and menstrual cycle: Secondary | ICD-10-CM

## 2024-09-25 ENCOUNTER — Telehealth (INDEPENDENT_AMBULATORY_CARE_PROVIDER_SITE_OTHER): Payer: Self-pay

## 2024-09-25 NOTE — Telephone Encounter (Signed)
 Per the last time we spoke the patient wanted to be scheduled for her procedure 11/03/24. Patient has been scheduled and the pre-procedure instructions will be sent to Mychart and mailed.

## 2024-09-25 NOTE — Telephone Encounter (Signed)
 Patient called at this time requesting information about her upcoming surgery, tried to call patient back to answer her questions with no answer at this time, will try to call back later.

## 2024-11-03 ENCOUNTER — Encounter: Admission: RE | Payer: Self-pay | Source: Home / Self Care

## 2024-11-03 ENCOUNTER — Ambulatory Visit: Admission: RE | Admit: 2024-11-03 | Source: Home / Self Care | Admitting: Vascular Surgery

## 2024-11-03 DIAGNOSIS — N9489 Other specified conditions associated with female genital organs and menstrual cycle: Secondary | ICD-10-CM

## 2024-11-03 SURGERY — EMBOLIZATION
Anesthesia: Moderate Sedation
